# Patient Record
Sex: Female | Born: 1975 | Race: White | Hispanic: No | Marital: Married | Smoking: Former smoker
Health system: Southern US, Community
[De-identification: ages and names within clinical notes are randomized; demographics above are authoritative.]

## PROBLEM LIST (undated history)

## (undated) DIAGNOSIS — G43909 Migraine, unspecified, not intractable, without status migrainosus: Secondary | ICD-10-CM

## (undated) DIAGNOSIS — F419 Anxiety disorder, unspecified: Secondary | ICD-10-CM

## (undated) DIAGNOSIS — R51 Headache: Secondary | ICD-10-CM

## (undated) DIAGNOSIS — Z803 Family history of malignant neoplasm of breast: Secondary | ICD-10-CM

## (undated) HISTORY — PX: WISDOM TOOTH EXTRACTION: SHX21

## (undated) HISTORY — DX: Family history of malignant neoplasm of breast: Z80.3

## (undated) HISTORY — DX: Anxiety disorder, unspecified: F41.9

---
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
## 2010-09-05 ENCOUNTER — Encounter: Payer: Self-pay | Admitting: Family Medicine

## 2010-09-05 ENCOUNTER — Ambulatory Visit (INDEPENDENT_AMBULATORY_CARE_PROVIDER_SITE_OTHER): Payer: 59 | Admitting: Family Medicine

## 2010-09-05 VITALS — BP 113/81 | HR 97 | Ht 63.5 in | Wt 163.0 lb

## 2010-09-05 DIAGNOSIS — Z23 Encounter for immunization: Secondary | ICD-10-CM

## 2010-09-05 DIAGNOSIS — J309 Allergic rhinitis, unspecified: Secondary | ICD-10-CM

## 2010-09-05 MED ORDER — TETANUS-DIPHTH-ACELL PERTUSSIS 5-2-15.5 LF-MCG/0.5 IM SUSP: 0.5000 mL | Freq: Once | INTRAMUSCULAR | Status: DC

## 2010-09-05 NOTE — Assessment & Plan Note (Signed)
Will start Qnasl sample daily for nasal congestion/ postnasal drip, long hx of allergies.  Call if working well for RX.  RTC for physical/ fasting labs in 3 mos with flu shot. PPD and Tdap today.

## 2010-09-05 NOTE — Progress Notes (Signed)
  Subjective:    Patient ID: Jasmine Gomez, female    DOB: 03/11/1975, 35 y.o.   MRN: [id number]  HPI  35 yo Wf presents for NOV.  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  She just moved here from [place] with her family.  Her late father in law was Elmer Boutelle.  She has hx of allergies and sinus surgery and quit smoking a year ago but is o/w healthy.  She needs  A PPD today and her Tdap.  BP 113/81  Pulse 97  Ht 5' 3.5" (1.613 m)  Wt 163 lb (73.936 kg)  BMI 28.42 kg/m2  SpO2 98%  LMP 08/27/2010  BP 113/81  Pulse 97  Ht 5' 3.5" (1.613 m)  Wt 163 lb (73.936 kg)  BMI 28.42 kg/m2  SpO2 98%  LMP 08/27/2010  No past medical history on file.  Past Surgical History  Procedure Date  . Nasal sinus surgery 1989  . Tonsillectomy 1988    Family History  Problem Relation Age of Onset  . Cancer Mother   . Hyperlipidemia Father   . Cancer Sister     History   Social History  . Marital Status: Married    Spouse Name: Casimiro Needle    Number of Children: 2  . Years of Education: 17   Occupational History  . teacher     [employer]   Social History Main Topics  . Smoking status: Former Smoker -- 10 years  . Smokeless tobacco: Not on file  . Alcohol Use: 1.2 oz/week    2 Glasses of wine per week  . Drug Use: No  . Sexually Active: Yes    Birth Control/ Protection: Condom   Other Topics Concern  . Not on file   Social History Narrative  . No narrative on file    Not on File  No current outpatient prescriptions on file. Current facility-administered medications:TDaP (ADACEL) injection 0.5 mL, 0.5 mL, Intramuscular, Once, Seymour Bars, DO  Review of Systems  HENT: Positive for congestion and postnasal drip. Negative for rhinorrhea.   Respiratory: Negative for shortness of breath.   Cardiovascular: Negative for chest pain and palpitations.  Neurological: Negative for headaches.       Objective:   Physical Exam  Constitutional: She  appears well-developed and well-nourished. No distress.  HENT:  Right Ear: External ear normal.  Left Ear: External ear normal.  Mouth/Throat: Oropharynx is clear and moist.       Cobblestoning in the posterior oropharynx; boggy L>R nasal turbinates  Eyes: Conjunctivae are normal.  Cardiovascular: Normal rate, regular rhythm and normal heart sounds.   No murmur heard. Pulmonary/Chest: Effort normal and breath sounds normal.  Musculoskeletal: She exhibits no edema.  Lymphadenopathy:    She has no cervical adenopathy.  Skin: Skin is warm and dry.  Psychiatric: She has a normal mood and affect.          Assessment & Plan:

## 2010-09-05 NOTE — Patient Instructions (Signed)
Start Qnasl 2 sprays per nostril once daily. Add OTC anti histamine if needed for runny nose, sneezing, etc.  Return to have PPD read on Monday and will give you yellow form then.  Tdap updated today.

## 2010-09-08 LAB — TB SKIN TEST: TB Skin Test: NEGATIVE mm

## 2010-10-02 ENCOUNTER — Encounter: Payer: 59 | Admitting: Family Medicine

## 2010-10-02 DIAGNOSIS — Z0289 Encounter for other administrative examinations: Secondary | ICD-10-CM

## 2011-01-01 ENCOUNTER — Encounter: Payer: Self-pay | Admitting: *Deleted

## 2011-01-01 ENCOUNTER — Emergency Department
Admission: EM | Admit: 2011-01-01 | Discharge: 2011-01-01 | Disposition: A | Discharge status: Home / Self Care | Payer: BC Managed Care – PPO | Source: Home / Self Care | Attending: Emergency Medicine | Admitting: Emergency Medicine

## 2011-01-01 DIAGNOSIS — J101 Influenza due to other identified influenza virus with other respiratory manifestations: Secondary | ICD-10-CM

## 2011-01-01 DIAGNOSIS — J111 Influenza due to unidentified influenza virus with other respiratory manifestations: Secondary | ICD-10-CM

## 2011-01-01 LAB — POCT INFLUENZA A/B
Influenza A, POC: POSITIVE
Influenza B, POC: NEGATIVE

## 2011-01-01 MED ORDER — OSELTAMIVIR PHOSPHATE 75 MG PO CAPS: ORAL_CAPSULE | ORAL | Status: AC

## 2011-01-01 NOTE — ED Notes (Signed)
Patient c/o fever, body aches, HA x yesterday. She had a flu shot on Monday. 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 She also c/o sharp pains in her left abdomen intermittent x today.

## 2011-01-01 NOTE — ED Provider Notes (Signed)
History     CSN: [id number] Arrival date & time: 01/01/2011  4:16 PM   First MD Initiated Contact with Patient 01/01/11 1608      Chief Complaint  Patient presents with  . Fever  . Generalized Body Aches    HPI Comments: HPI : Flu symptoms for about 1 day. Fever to 102 with chills, sweats, myalgias, fatigue, headache. Symptoms are progressively worsening, despite trying OTC fever reducing medicine and rest and fluids. Has decreased appetite, but tolerating some liquids by mouth. No history of recent tick bite.  Review of Systems: Positive for fatigue, mild nasal congestion, mild sore throat, mild swollen anterior neck glands, mild cough. Negative for acute vision changes, stiff neck, focal weakness, syncope, seizures, respiratory distress, vomiting, diarrhea, GU symptoms.  Patient is a 35 y.o. female presenting with fever. The history is provided by the patient.  Fever Primary symptoms of the febrile illness include fever, fatigue, headaches, cough (Nonproductive) and myalgias (Severe). Primary symptoms do not include wheezing, shortness of breath, abdominal pain, nausea, vomiting, diarrhea, dysuria, altered mental status or rash. The current episode started yesterday. The problem has been rapidly worsening.    History reviewed. No pertinent past medical history.  Past Surgical History  Procedure Date  . Nasal sinus surgery 1989  . Tonsillectomy 1988    Family History  Problem Relation Age of Onset  . Cancer Mother   . Hyperlipidemia Father   . Cancer Sister     History  Substance Use Topics  . Smoking status: Former Smoker -- 10 years  . Smokeless tobacco: Not on file  . Alcohol Use: 1.2 oz/week    2 Glasses of wine per week    OB History    Grav Para Term Preterm Abortions TAB SAB Ect Mult Living                  Review of Systems  Constitutional: Positive for fever, chills, appetite change and fatigue.  HENT: Positive for rhinorrhea (Serous).   Respiratory:  Positive for cough (Nonproductive). Negative for shortness of breath and wheezing.   Cardiovascular: Negative.   Gastrointestinal: Negative.  Negative for nausea, vomiting, abdominal pain and diarrhea.  Genitourinary: Negative.  Negative for dysuria.  Musculoskeletal: Positive for myalgias (Severe).  Skin: Negative for rash.  Neurological: Positive for headaches.  Hematological: Negative.   Psychiatric/Behavioral: Negative.  Negative for altered mental status.  All other systems reviewed and are negative.    Allergies  Review of patient's allergies indicates no known allergies.  Home Medications   Current Outpatient Rx  Name Route Sig Dispense Refill  . OSELTAMIVIR PHOSPHATE 75 MG PO CAPS  Starting today, take 1 capsule by mouth twice a day for 5 days. 10 capsule 0    BP 113/76  Pulse 102  Temp(Src) 101.1 F (38.4 C) (Oral)  Resp 14  Ht 5\' 4"  (1.626 m)  Wt 161 lb (73.029 kg)  BMI 27.64 kg/m2  SpO2 99%  Physical Exam  Nursing note and vitals reviewed. Constitutional: She appears well-developed and well-nourished.  Non-toxic appearance. She appears ill (very fatigued, but no cardiorespiratory distress). No distress.  HENT:  Head: Normocephalic and atraumatic.  Right Ear: Tympanic membrane and external ear normal.  Left Ear: Tympanic membrane and external ear normal.  Nose: Rhinorrhea present.  Mouth/Throat: Mucous membranes are normal. Posterior oropharyngeal erythema (mild redness ) present. No oropharyngeal exudate.  Eyes: Conjunctivae are normal. Right eye exhibits no discharge. Left eye exhibits no discharge. No scleral icterus.  Neck: Neck supple.  Cardiovascular: Normal rate, regular rhythm and normal heart sounds.   Pulmonary/Chest: Breath sounds normal. No stridor. No respiratory distress. She has no wheezes. She has no rales.  Abdominal: Soft. There is no tenderness.  Musculoskeletal: She exhibits no edema.  Lymphadenopathy:    She has cervical adenopathy (mild  shoddy anterior cervical nodes).  Neurological: She is alert.  Skin: Skin is warm and intact. No rash noted. She is diaphoretic.  Psychiatric: She has a normal mood and affect.   neurologic exam within normal limits, nonfocal  ED Course  Procedures (including critical care time)   Labs Reviewed  POCT INFLUENZA A/B     1. Influenza A       MDM  Her rapid flu test is positive for influenza A., negative for influenza B. After risks benefits alternatives discussed, Tamiflu prescribed. Other symptomatic care discussed. See AVS for other information and advice. Contagiousness discussed.        Lonell Face, MD 01/01/11 [phone]

## 2011-01-16 ENCOUNTER — Telehealth: Payer: Self-pay | Admitting: Family Medicine

## 2011-01-16 DIAGNOSIS — Z1231 Encounter for screening mammogram for malignant neoplasm of breast: Secondary | ICD-10-CM

## 2011-01-16 NOTE — Telephone Encounter (Signed)
Has She double checked with her insurance to make sure that they will cover it at 35? Or does she have a family history of breast cancer. I will place the order.

## 2011-01-16 NOTE — Telephone Encounter (Signed)
Patient called request to get an order to have a mammogram

## 2011-01-16 NOTE — Telephone Encounter (Signed)
She has family history of breast cancer....mat GM, mother and sister.

## 2011-02-03 ENCOUNTER — Ambulatory Visit
Admission: RE | Admit: 2011-02-03 | Discharge: 2011-02-03 | Disposition: A | Discharge status: Home / Self Care | Payer: BC Managed Care – PPO | Source: Ambulatory Visit | Attending: Family Medicine | Admitting: Family Medicine

## 2011-02-03 DIAGNOSIS — Z1231 Encounter for screening mammogram for malignant neoplasm of breast: Secondary | ICD-10-CM

## 2011-03-25 ENCOUNTER — Telehealth: Payer: Self-pay | Admitting: *Deleted

## 2011-03-25 NOTE — Telephone Encounter (Signed)
Pt calls and states last night started running a temp of 101.5, H/A, bodyaches, congestion, chills, feels horrible and ? If its the flu. Do you want to see her or just send a med to pharmacy. Sx's started last night

## 2011-03-25 NOTE — Telephone Encounter (Signed)
Dallie Dad, LPN 1/61/0960 45:40 AM Signed  Pt calls and states last night started running a temp of 101.5, H/A, bodyaches, congestion, chills, feels horrible and ? If its the flu. Do you want to see her or just send a med to pharmacy. Sx's started last night

## 2011-03-25 NOTE — Telephone Encounter (Signed)
Because of the shortage of Tamiflu they are only recommending for pediatric and elderly patient. THus cold meds will be her friend, hydration, humidifier, vicks vapor rub.

## 2011-03-25 NOTE — Telephone Encounter (Signed)
Pt notified of MD instructions. KJ LPN 

## 2011-11-16 ENCOUNTER — Ambulatory Visit (INDEPENDENT_AMBULATORY_CARE_PROVIDER_SITE_OTHER): Payer: BC Managed Care – PPO | Admitting: Family Medicine

## 2011-11-16 ENCOUNTER — Encounter: Payer: BC Managed Care – PPO | Admitting: Family Medicine

## 2011-11-16 ENCOUNTER — Other Ambulatory Visit (HOSPITAL_COMMUNITY)
Admission: RE | Admit: 2011-11-16 | Discharge: 2011-11-16 | Disposition: A | Discharge status: Home / Self Care | Payer: BC Managed Care – PPO | Source: Ambulatory Visit | Attending: Family Medicine | Admitting: Family Medicine

## 2011-11-16 ENCOUNTER — Encounter: Payer: Self-pay | Admitting: Family Medicine

## 2011-11-16 VITALS — BP 118/82 | HR 80 | Temp 98.0°F | Ht 64.0 in | Wt 157.0 lb

## 2011-11-16 DIAGNOSIS — J029 Acute pharyngitis, unspecified: Secondary | ICD-10-CM

## 2011-11-16 DIAGNOSIS — Z01419 Encounter for gynecological examination (general) (routine) without abnormal findings: Secondary | ICD-10-CM

## 2011-11-16 DIAGNOSIS — Z803 Family history of malignant neoplasm of breast: Secondary | ICD-10-CM

## 2011-11-16 DIAGNOSIS — R030 Elevated blood-pressure reading, without diagnosis of hypertension: Secondary | ICD-10-CM

## 2011-11-16 DIAGNOSIS — Z1151 Encounter for screening for human papillomavirus (HPV): Secondary | ICD-10-CM | POA: Insufficient documentation

## 2011-11-16 DIAGNOSIS — G43109 Migraine with aura, not intractable, without status migrainosus: Secondary | ICD-10-CM | POA: Insufficient documentation

## 2011-11-16 DIAGNOSIS — Z23 Encounter for immunization: Secondary | ICD-10-CM

## 2011-11-16 HISTORY — DX: Family history of malignant neoplasm of breast: Z80.3

## 2011-11-16 LAB — POCT RAPID STREP A (OFFICE): Rapid Strep A Screen: NEGATIVE

## 2011-11-16 MED ORDER — FROVATRIPTAN SUCCINATE 2.5 MG PO TABS: 2.5000 mg | ORAL_TABLET | ORAL | Status: DC | PRN

## 2011-11-16 NOTE — Progress Notes (Signed)
Subjective:     Jasmine Gomez is a 36 y.o. female and is here for a comprehensive physical exam. The patient reports problems - migraines. She says her migraines have been well controlled since the birth of her kids but had a bad one last week. lasted 3 days. She does get aura. vomited.  Tried execedrin migraine with no relief.  Marland Kitchen She's also had a sore throat for couple days. No fever. She has no other upper respiratory symptoms except for the sore throat. A little bit of postnasal drip. She's not taking any medications for. 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  History   Social History  . Marital Status: Married    Spouse Name: Casimiro Needle    Number of Children: 2  . Years of Education: 17   Occupational History  . teacher     [employer]   Social History Main Topics  . Smoking status: Former Smoker -- 10 years  . Smokeless tobacco: Not on file  . Alcohol Use: 1.2 oz/week    2 Glasses of wine per week  . Drug Use: No  . Sexually Active: Yes    Birth Control/ Protection: Condom   Other Topics Concern  . Not on file   Social History Narrative  . No narrative on file   Health Maintenance  Topic Date Due  . Influenza Vaccine  09/26/2012  . Pap Smear  11/16/2014  . Tetanus/tdap  09/04/2020    The following portions of the patient's history were reviewed and updated as appropriate: allergies, current medications, past family history, past medical history, past social history, past surgical history and problem list.  Review of Systems A comprehensive review of systems was negative.   Objective:    BP 147/79  Pulse 80  Temp 98 F (36.7 C)  Ht 5\' 4"  (1.626 m)  Wt 157 lb (71.215 kg)  BMI 26.95 kg/m2 General appearance: alert, cooperative and appears stated age Head: Normocephalic, without obvious abnormality, atraumatic Eyes: conj clear, EOMi, PEERLA Ears: normal TM's and external ear canals both ears Nose: Nares normal.  Septum midline. Mucosa normal. No drainage or sinus tenderness. Throat: lips, mucosa, and tongue normal; teeth and gums normal Neck: no adenopathy, no carotid bruit, no JVD, supple, symmetrical, trachea midline and thyroid not enlarged, symmetric, no tenderness/mass/nodules Back: symmetric, no curvature. ROM normal. No CVA tenderness. Lungs: clear to auscultation bilaterally Breasts: normal appearance, no masses or tenderness Heart: regular rate and rhythm, S1, S2 normal, no murmur, click, rub or gallop Abdomen: soft, non-tender; bowel sounds normal; no masses,  no organomegaly Pelvic: cervix normal in appearance, external genitalia normal, no adnexal masses or tenderness, no cervical motion tenderness, rectovaginal septum normal, uterus normal size, shape, and consistency and vagina normal without discharge Extremities: extremities normal, atraumatic, no cyanosis or edema Pulses: 2+ and symmetric Skin: Skin color, texture, turgor normal. No rashes or lesions Lymph nodes: Cervical, supraclavicular, and axillary nodes normal. Neurologic: Alert and oriented X 3, normal strength and tone. Normal symmetric reflexes. Normal coordination and gait    Assessment:    Healthy female exam.      Plan:    Start a regular exercise program and make sure you are eating a healthy diet Try to eat 4 servings of dairy a day or take a calcium supplement (500mg  twice a day). Your vaccines are up to date.    Migarines w/ aura - Discussed inc risk of stroke.  Make sure  BP  well controlled and cholesterol also well controlled. Due for fasting lab work. We'll check CT and lipids. I don't think she needs anything prophylactic at this point we do find her rescue medication that works consistently for her. Gave Frova samples.    Pharyngitis-will check for strep since she has had some exposure. They likely viral. Rapid strep is negative.  Continue symptomatic care.   Elevated BP - Recheck was normal.  Will keep an  eye on it.   See After Visit Summary for Counseling Recommendations   Flu vaccine given today.

## 2011-11-18 LAB — LIPID PANEL
Cholesterol: 220 mg/dL — ABNORMAL HIGH (ref 0–200)
Total CHOL/HDL Ratio: 5.1 Ratio

## 2011-11-18 LAB — COMPLETE METABOLIC PANEL WITH GFR
ALT: 12 U/L (ref 0–35)
CO2: 30 mEq/L (ref 19–32)
Calcium: 9.4 mg/dL (ref 8.4–10.5)
Chloride: 103 mEq/L (ref 96–112)
Creat: 0.74 mg/dL (ref 0.50–1.10)
GFR, Est African American: >89 mL/min
GFR, Est Non African American: >89 mL/min
Glucose, Bld: 86 mg/dL (ref 70–99)
Total Bilirubin: 0.6 mg/dL (ref 0.3–1.2)

## 2011-11-30 ENCOUNTER — Telehealth: Payer: Self-pay | Admitting: *Deleted

## 2011-12-10 NOTE — Telephone Encounter (Signed)
See other message

## 2012-01-15 ENCOUNTER — Ambulatory Visit (INDEPENDENT_AMBULATORY_CARE_PROVIDER_SITE_OTHER): Payer: BC Managed Care – PPO | Admitting: Physician Assistant

## 2012-01-15 ENCOUNTER — Encounter: Payer: Self-pay | Admitting: Physician Assistant

## 2012-01-15 VITALS — BP 127/79 | HR 98 | Temp 98.9°F | Resp 16 | Wt 155.0 lb

## 2012-01-15 DIAGNOSIS — J069 Acute upper respiratory infection, unspecified: Secondary | ICD-10-CM

## 2012-01-15 DIAGNOSIS — Z2089 Contact with and (suspected) exposure to other communicable diseases: Secondary | ICD-10-CM

## 2012-01-15 DIAGNOSIS — R0981 Nasal congestion: Secondary | ICD-10-CM

## 2012-01-15 DIAGNOSIS — Z20818 Contact with and (suspected) exposure to other bacterial communicable diseases: Secondary | ICD-10-CM

## 2012-01-15 DIAGNOSIS — J3489 Other specified disorders of nose and nasal sinuses: Secondary | ICD-10-CM

## 2012-01-15 LAB — POCT RAPID STREP A (OFFICE): Rapid Strep A Screen: NEGATIVE

## 2012-01-15 MED ORDER — FLUTICASONE PROPIONATE 50 MCG/ACT NA SUSP: 2.0000 | Freq: Every day | NASAL | Status: DC

## 2012-01-15 NOTE — Progress Notes (Signed)
  Subjective:    Patient ID: Jasmine Gomez, female    DOB: March 25, 1975, 36 y.o.   MRN: [id number]  HPI Patient presents to the clinic with nasal congestion, sinus pressure, and sinus congestion. She has a mild sore throat. She wants to be tested for strep because both of her sons have strep. Her symptoms started 2 days ago. She has tried Dayquil and not really helped. She denies fever, chills, muscle aches. She has been having HA's and runny to congested nose. Denies any N/V/D.    Review of Systems     Objective:   Physical Exam  Constitutional: She is oriented to person, place, and time. She appears well-developed and well-nourished.  HENT:  Head: Normocephalic and atraumatic.  Right Ear: External ear normal.  Left Ear: External ear normal.       TM's clear bilaterally.   Oropharynx red without exudate.   Bilateral turbinates red and swollen with rhinorrhea.   No sinus pressure.  Eyes: Conjunctivae normal are normal.  Neck: Normal range of motion. Neck supple.  Cardiovascular: Normal rate, regular rhythm and normal heart sounds.   Pulmonary/Chest: Effort normal and breath sounds normal. She has no wheezes.  Lymphadenopathy:    She has no cervical adenopathy.  Neurological: She is alert and oriented to person, place, and time.  Skin: Skin is warm and dry.  Psychiatric: She has a normal mood and affect. Her behavior is normal.          Assessment & Plan:  URI- Rapid strep was negative. Discussed Viral etiology. Symptomatic treatment with Mucinex D BID drinking lots of water. Load up on Vitamin C and Zinc. Discussed could turn into infection if mucus sits around long enough work hard to get mucus out consider sinus rinse. If not improving in 6-8 days or worsening give [place] a call and will consider calling in abx. Did give nasal spray to try 2 sprays once a day.

## 2012-01-15 NOTE — Patient Instructions (Addendum)

## 2012-04-04 ENCOUNTER — Encounter: Payer: Self-pay | Admitting: Family Medicine

## 2012-04-04 ENCOUNTER — Ambulatory Visit (INDEPENDENT_AMBULATORY_CARE_PROVIDER_SITE_OTHER): Payer: BC Managed Care – PPO | Admitting: Family Medicine

## 2012-04-04 VITALS — BP 108/71 | HR 78 | Ht 64.0 in | Wt 161.0 lb

## 2012-04-04 DIAGNOSIS — R41840 Attention and concentration deficit: Secondary | ICD-10-CM

## 2012-04-04 DIAGNOSIS — F430 Acute stress reaction: Secondary | ICD-10-CM

## 2012-04-04 NOTE — Progress Notes (Signed)
  Subjective:    Patient ID: Jasmine Gomez, female    DOB: 1975/03/20, 37 y.o.   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 Will constantly get distracted and get up form what she is doing. Feels like it is affecting her work. Says can't even watch a movie. Will be on the ipad adn doing facebook.  Hard to concentrate on reading material and then has to re-read it. Says always fidgeting and picking.  Says has had this for years.  Did well in school. Lately strugging with appointments.  Feels she does worry a lot and feels anxious.  Denies feeling depressed. Interups people frequently. She has also had a rough year. She and her husband moved her about 2 years ago. He had difficulty finding a job and they first moved here, for about a year and half. Thus they went through the money that they had made off the sell of the house. He finally found a job. 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 This is also been very stressful for her. She says at times she feels down but does not feel depressed per se.   Review of Systems     Objective:   Physical Exam  Constitutional: She is oriented to person, place, and time. She appears well-developed and well-nourished.  HENT:  Head: Normocephalic and atraumatic.  Eyes: Conjunctivae and EOM are normal.  Cardiovascular: Normal rate.   Pulmonary/Chest: Effort normal.  Neurological: She is alert and oriented to person, place, and time.  Skin: Skin is dry. No pallor.  Psychiatric: She has a normal mood and affect. Her behavior is normal.          Assessment & Plan:  Suspect ADHD - based on her discussion of her symptoms I do think she is most likely ADHD. I gave her an adult ADHD/RS/4 with adults prompts questionnaire. She scored 38 (positive for ADHD) .  Will refer to Dr. Dellia Cloud for futher evaluation for ADHD. I will see her back after I get her  formal evaluation we can discuss possible treatment options. I did encourage her to get a book about ADD and ADHD. I think would be very helpful for her to read this if she is formally diagnosed.  Mild depression- pHQ-9 score  17f 8. Though she scored trouble concentrating as of 3. I think she does have some mild depressive symptoms but I feel this is unrelated to her ADD sxs.  We will keep an eye on this. Please call she feels that her moods getting worse, , or if she starts to feel hopeless.  Time spent 30 min,>50% spent in evaluation and counseling about possible ADHD.

## 2012-07-25 ENCOUNTER — Ambulatory Visit (INDEPENDENT_AMBULATORY_CARE_PROVIDER_SITE_OTHER): Payer: BC Managed Care – PPO | Admitting: Family Medicine

## 2012-07-25 ENCOUNTER — Encounter: Payer: Self-pay | Admitting: Family Medicine

## 2012-07-25 VITALS — BP 111/74 | HR 63 | Wt 163.0 lb

## 2012-07-25 DIAGNOSIS — N760 Acute vaginitis: Secondary | ICD-10-CM

## 2012-07-25 DIAGNOSIS — N926 Irregular menstruation, unspecified: Secondary | ICD-10-CM

## 2012-07-25 LAB — WET PREP FOR TRICH, YEAST, CLUE
Clue Cells Wet Prep HPF POC: NONE SEEN
Yeast Wet Prep HPF POC: NONE SEEN

## 2012-07-25 LAB — TSH: TSH: 1.834 u[IU]/mL (ref 0.350–4.500)

## 2012-07-25 MED ORDER — FLUCONAZOLE 150 MG PO TABS: ORAL_TABLET | ORAL | Status: AC

## 2012-07-25 NOTE — Progress Notes (Signed)
CC: Jasmine Gomez is a 37 y.o. female is here for Amenorrhea and Vaginitis   Subjective: HPI:  Patient complaint of a missed period that was expected prior to the 15th of this month.  Prior to this her menses cycle was extremely predictable occurring every 28 days and lasting approximately 5 days. Her most recent period did linger about 3 days longer than she expected with spotting. She has taken 3 Pregnancy tests most recently one on Saturday all of which have been negative, she reports in her prior pregnancies she was quite symptomatic at this time following a missed period.  She denies dietary changes but does admit to starting 30-60 minutes of exercise everyday the week since she had her last period prior to this she describes herself as quite sedentary. She denies any abdominal pain, pelvic pain, fatigue, heat or cold intolerance, unintentional weight gain or loss, peripheral vision loss nor headaches.  She complains of an irritation and redness between her legs in the genital region without actual discharge nor dysuria. It is slightly improved with Monistat but is still bothering her with moderate burning that is nonradiating. Nothing else has made it better or worse it is been present for about a week.   Review Of Systems Outlined In HPI  Past Medical History  Diagnosis Date  . Family history of breast cancer 11/16/2011    Mother and sister with  BrCa.  Patient is neg for BrCa gene.        Family History  Problem Relation Age of Onset  . Breast cancer Mother   . Hyperlipidemia Father   . Breast cancer Sister      History  Substance Use Topics  . Smoking status: Former Smoker -- 10 years  . Smokeless tobacco: Not on file  . Alcohol Use: 1.2 oz/week    2 Glasses of wine per week     Objective: Filed Vitals:   07/25/12 1111  BP: 111/74  Pulse: 63    Vital signs reviewed. General: Alert and Oriented, No Acute Distress HEENT: Pupils equal, round, reactive to light.  Conjunctivae clear.  External ears unremarkable.  Moist mucous membranes. Lungs: Clear and comfortable work of breathing, speaking in full sentences without accessory muscle use. Cardiac: Regular rate and rhythm.  Neuro: CN II-XII grossly intact, gait normal. Extremities: No peripheral edema.  Strong peripheral pulses.  Mental Status: No depression, anxiety, nor agitation. Logical though process. Skin: Warm and dry.  Assessment & Plan: Jasmine Gomez was seen today for amenorrhea and vaginitis.  Diagnoses and associated orders for this visit:  Vaginitis and vulvovaginitis - Cervicovaginal ancillary only - fluconazole (DIFLUCAN) 150 MG tablet; Take one tab, may take second tab if no improvement after 72 hours.  Irregular menstrual cycle - Prolactin - TSH    Irregular menstrual cycle: Patient declines urinary pregnancy test is open to the idea of ruling out prolactin or thyroid abnormalities discussed possibility that her significant increase in daily exercise could be contributing to her irregular cycle. Vaginitis: Suspicion for vaginal candidiasis start Diflucan and a wet prep has been obtained  Return if symptoms worsen or fail to improve.

## 2012-07-25 NOTE — Addendum Note (Signed)
Addended by: Wyline Beady on: 07/25/2012 02:25 PM   Modules accepted: Orders

## 2012-12-02 ENCOUNTER — Encounter: Payer: Self-pay | Admitting: Family Medicine

## 2012-12-02 ENCOUNTER — Ambulatory Visit (INDEPENDENT_AMBULATORY_CARE_PROVIDER_SITE_OTHER): Payer: BC Managed Care – PPO | Admitting: Family Medicine

## 2012-12-02 VITALS — BP 117/76 | HR 73 | Temp 99.0°F | Wt 156.0 lb

## 2012-12-02 DIAGNOSIS — B9689 Other specified bacterial agents as the cause of diseases classified elsewhere: Secondary | ICD-10-CM

## 2012-12-02 DIAGNOSIS — J329 Chronic sinusitis, unspecified: Secondary | ICD-10-CM

## 2012-12-02 DIAGNOSIS — A499 Bacterial infection, unspecified: Secondary | ICD-10-CM

## 2012-12-02 DIAGNOSIS — R05 Cough: Secondary | ICD-10-CM

## 2012-12-02 MED ORDER — AMOXICILLIN-POT CLAVULANATE 500-125 MG PO TABS: ORAL_TABLET | ORAL | Status: AC

## 2012-12-02 MED ORDER — HYDROCODONE-HOMATROPINE 5-1.5 MG/5ML PO SYRP: 5.0000 mL | ORAL_SOLUTION | Freq: Every evening | ORAL | Status: DC | PRN

## 2012-12-02 NOTE — Progress Notes (Signed)
CC: Jasmine Gomez is a 37 y.o. female is here for Nasal Congestion and Cough   Subjective: HPI:  Complains of cough dry nonproductive along with facial pressure below both eyes that has been present for one week worsening on a daily basis present all hours of the day at least mild severity however moderate in the evening when lying down for sleep. Asked him that she's had difficulty sleeping due to the cough. She describes subjective postnasal drip. Has tried over-the-counter cough syrup without much improvement. She reports fatigue but denies fevers, chills, motor sensory disturbances, shortness of breath, wheezing, chest pain nor myalgias.  Review Of Systems Outlined In HPI  Past Medical History  Diagnosis Date  . Family history of breast cancer 11/16/2011    Mother and sister with  BrCa.  Patient is neg for BrCa gene.        Family History  Problem Relation Age of Onset  . Breast cancer Mother   . Hyperlipidemia Father   . Breast cancer Sister      History  Substance Use Topics  . Smoking status: Former Smoker -- 10 years  . Smokeless tobacco: Not on file  . Alcohol Use: 1.2 oz/week    2 Glasses of wine per week     Objective: Filed Vitals:   12/02/12 1428  BP: 117/76  Pulse: 73  Temp: 99 F (37.2 C)    General: Alert and Oriented, No Acute Distress HEENT: Pupils equal, round, reactive to light. Conjunctivae clear.  External ears unremarkable, canals clear with intact TMs with appropriate landmarks.  Middle ear appears open without effusion.  mildly swollen and erythematous inferior turbinates with moderate mucoid discharge.  Moist mucous membranes, pharynx without inflammation nor lesions.  Neck supple without palpable lymphadenopathy nor abnormal masses.Maxillary sinus tenderness to percussion bilaterally  Lungs: Clear to auscultation bilaterally, no wheezing/ronchi/rales.  Comfortable work of breathing. Good air movement. Mental Status: No depression, anxiety, nor  agitation. Skin: Warm and dry.  Assessment & Plan: Freya was seen today for nasal congestion and cough.  Diagnoses and associated orders for this visit:  Bacterial sinusitis - HYDROcodone-homatropine (HYCODAN) 5-1.5 MG/5ML syrup; Take 5 mLs by mouth at bedtime as needed for cough. - amoxicillin-clavulanate (AUGMENTIN) 500-125 MG per tablet; Take one by mouth every 8 hours for ten total days.  Cough - HYDROcodone-homatropine (HYCODAN) 5-1.5 MG/5ML syrup; Take 5 mLs by mouth at bedtime as needed for cough. - amoxicillin-clavulanate (AUGMENTIN) 500-125 MG per tablet; Take one by mouth every 8 hours for ten total days.    Bacterial sinusitis resulting postnasal drip causing cough start Augmentin consider Hycodan to help with sleep, consider Alka-Seltzer cold and sinus over-the-counter to help with symptoms as needed  Return if symptoms worsen or fail to improve.

## 2012-12-26 ENCOUNTER — Encounter: Payer: Self-pay | Admitting: Family Medicine

## 2012-12-26 ENCOUNTER — Ambulatory Visit (INDEPENDENT_AMBULATORY_CARE_PROVIDER_SITE_OTHER): Payer: BC Managed Care – PPO | Admitting: Family Medicine

## 2012-12-26 VITALS — BP 120/76 | HR 82 | Temp 99.7°F | Ht 64.0 in | Wt 155.0 lb

## 2012-12-26 DIAGNOSIS — Z331 Pregnant state, incidental: Secondary | ICD-10-CM

## 2012-12-26 DIAGNOSIS — Z3490 Encounter for supervision of normal pregnancy, unspecified, unspecified trimester: Secondary | ICD-10-CM

## 2012-12-26 DIAGNOSIS — N912 Amenorrhea, unspecified: Secondary | ICD-10-CM

## 2012-12-26 DIAGNOSIS — Z309 Encounter for contraceptive management, unspecified: Secondary | ICD-10-CM

## 2012-12-26 DIAGNOSIS — Z3201 Encounter for pregnancy test, result positive: Secondary | ICD-10-CM

## 2012-12-26 LAB — POCT URINE PREGNANCY: Preg Test, Ur: POSITIVE

## 2012-12-26 MED ORDER — COMPLETENATE 29-1 MG PO CHEW: 1.0000 | CHEWABLE_TABLET | Freq: Every day | ORAL | Status: DC

## 2012-12-26 NOTE — Patient Instructions (Addendum)
Estimated due date is July 25th, 2015 You are 6 weeks and 2 days today.

## 2012-12-26 NOTE — Progress Notes (Signed)
   Subjective:    Patient ID: Jasmine Gomez, female    DOB: 28-Mar-1975, 37 y.o.   MRN: [id number]  HPI LMP was 11/12/12 and has missed her period. . She has been tired and hungry all the time. Has had increased since of smell.  No pelvic pain or spotting.  Some cramping. Was on augmentin for sinus infection about 3 weeks ago.    Review of Systems     Objective:   Physical Exam  Constitutional: She is oriented to person, place, and time. She appears well-developed and well-nourished.  HENT:  Head: Normocephalic and atraumatic.  Neurological: She is alert and oriented to person, place, and time.  Skin: Skin is warm and dry.  Psychiatric: She has a normal mood and affect. Her behavior is normal.          Assessment & Plan:  Pregnancy-confirmed with urine pregnancy test. We'll get a serum quantitative. Estimated due date would be 08/19/2013. Her husband is here with her and is very supportive. She's excited about the pregnancy. We'll start her on prenatal vitamin. Samples given. We'll go ahead and send prescription to the pharmacy as well.Given samples of Concept DHA and Provida OB.  She prefers a chewable but these are hard to find.  Will refer

## 2013-01-06 ENCOUNTER — Telehealth: Payer: Self-pay | Admitting: *Deleted

## 2013-01-06 MED ORDER — PROVIDA OB 20-20-1.25 MG PO CAPS: 1.0000 | ORAL_CAPSULE | Freq: Every day | ORAL | Status: DC

## 2013-01-06 NOTE — Telephone Encounter (Signed)
The problem, new prescription sent.

## 2013-01-06 NOTE — Telephone Encounter (Signed)
Pt was prescribed the chewable prenatal vitamin but her ins doesn't cover it & she would like the provida OB sent in to her pharm instead.

## 2013-01-06 NOTE — Telephone Encounter (Signed)
Left rx information on vm.

## 2013-01-10 ENCOUNTER — Ambulatory Visit (INDEPENDENT_AMBULATORY_CARE_PROVIDER_SITE_OTHER): Payer: BC Managed Care – PPO | Admitting: Obstetrics & Gynecology

## 2013-01-10 ENCOUNTER — Encounter: Payer: Self-pay | Admitting: Obstetrics & Gynecology

## 2013-01-10 VITALS — BP 111/72 | Wt 159.0 lb

## 2013-01-10 DIAGNOSIS — O09529 Supervision of elderly multigravida, unspecified trimester: Secondary | ICD-10-CM | POA: Insufficient documentation

## 2013-01-10 DIAGNOSIS — O09521 Supervision of elderly multigravida, first trimester: Secondary | ICD-10-CM

## 2013-01-10 NOTE — Progress Notes (Signed)
p-89  Bedside U/S showed viable IUP with FHT 182 BPM  GA [redacted]w[redacted]d and CRL 22.76mm.  Last pap 2013

## 2013-01-10 NOTE — Addendum Note (Signed)
Addended by: Granville Lewis on: 01/10/2013 04:57 PM   Modules accepted: Orders

## 2013-01-10 NOTE — Progress Notes (Signed)
   Subjective:    Jasmine Gomez is a [id number] [redacted]w[redacted]d being seen today for her first obstetrical visit.  Her obstetrical history is significant for advanced maternal age. Patient does intend to breast feed. Pregnancy history fully reviewed.  Patient reports no complaints.  Filed Vitals:   01/10/13 1551  BP: 111/72  Weight: 159 lb (72.122 kg)    HISTORY: OB History  Gravida Para Term Preterm AB SAB TAB Ectopic Multiple Living  3 2 2       2     # Outcome Date GA Lbr Len/2nd Weight Sex Delivery Anes PTL Lv  3 CUR           2 TRM 08/26/07 [redacted]w[redacted]d  8 lb 6 oz (3.799 kg) M SVD EPI N Y  1 TRM 09/18/05 [redacted]w[redacted]d  8 lb 2 oz (3.685 kg) M SVD EPI N Y     Past Medical History  Diagnosis Date  . Family history of breast cancer 11/16/2011    Mother and sister with  BrCa.  Patient is neg for BrCa gene.      Past Surgical History  Procedure Laterality Date  . Nasal sinus surgery  1989  . Tonsillectomy  1988   Family History  Problem Relation Age of Onset  . Breast cancer Mother   . Hyperlipidemia Father   . Breast cancer Sister      Exam    Uterus:     Pelvic Exam:    Perineum: No Hemorrhoids   Vulva: normal   Vagina:  normal mucosa   pH: n/a   Cervix: no lesions   Adnexa: normal adnexa   Bony Pelvis: proven to 8 1/2 pounds  System: Breast:  normal appearance, no masses or tenderness   Skin: normal coloration and turgor, no rashes    Neurologic: oriented, normal   Extremities: no deformities   HEENT sclera clear, anicteric   Mouth/Teeth mucous membranes moist, pharynx normal without lesions and dental hygiene good   Neck supple and no masses   Cardiovascular: regular rate and rhythm   Respiratory:  chest clear, no wheezing, crepitations, rhonchi, normal symmetric air entry   Abdomen: soft, non-tender; bowel sounds normal; no masses,  no organomegaly   Urinary: urethral meatus normal      Assessment:    Pregnancy: [id number] Patient Active Problem List   Diagnosis Date  Noted  . Family history of breast cancer 11/16/2011  . Migraine with aura 11/16/2011  . Allergic rhinitis, cause unspecified 09/05/2010        Plan:     Initial labs drawn. Prenatal vitamins. Problem list reviewed and updated. Genetic Screening discussed and declines all testing  Ultrasound discussed; fetal survey: requested.  Follow up in 4 weeks. Bedside US confirms LMP dating.   Jasmine Gomez H. 01/10/2013

## 2013-01-11 LAB — PRESCRIPTION MONITORING PROFILE (19 PANEL)
Barbiturate Screen, Urine: NEGATIVE ng/mL
Benzodiazepine Screen, Urine: NEGATIVE ng/mL
Cannabinoid Scrn, Ur: NEGATIVE ng/mL
Carisoprodol, Urine: NEGATIVE ng/mL
Cocaine Metabolites: NEGATIVE ng/mL
Fentanyl, Ur: NEGATIVE ng/mL
Nitrites, Initial: NEGATIVE ug/mL
Opiate Screen, Urine: NEGATIVE ng/mL
Phencyclidine, Ur: NEGATIVE ng/mL
Propoxyphene: NEGATIVE ng/mL
Tapentadol, urine: NEGATIVE ng/mL
Tramadol Scrn, Ur: NEGATIVE ng/mL
Zolpidem, Urine: NEGATIVE ng/mL

## 2013-01-11 LAB — OBSTETRIC PANEL
Basophils Absolute: 0 10*3/uL (ref 0.0–0.1)
Basophils Relative: 0 % (ref 0–1)
Eosinophils Absolute: 0.1 10*3/uL (ref 0.0–0.7)
Eosinophils Relative: 1 % (ref 0–5)
Hepatitis B Surface Ag: NEGATIVE
Lymphs Abs: 3 10*3/uL (ref 0.7–4.0)
MCH: 32.1 pg (ref 26.0–34.0)
MCV: 92.3 fL (ref 78.0–100.0)
Neutrophils Relative %: 67 % (ref 43–77)
Platelets: 366 10*3/uL (ref 150–400)
RBC: 4.27 MIL/uL (ref 3.87–5.11)
RDW: 13.3 % (ref 11.5–15.5)

## 2013-01-11 LAB — HIV ANTIBODY (ROUTINE TESTING W REFLEX): HIV: NONREACTIVE

## 2013-01-11 LAB — CULTURE, URINE COMPREHENSIVE
Colony Count: NO GROWTH
Organism ID, Bacteria: NO GROWTH

## 2013-01-12 ENCOUNTER — Encounter: Payer: Self-pay | Admitting: Obstetrics & Gynecology

## 2013-01-12 DIAGNOSIS — O26891 Other specified pregnancy related conditions, first trimester: Secondary | ICD-10-CM

## 2013-01-12 DIAGNOSIS — Z6791 Unspecified blood type, Rh negative: Secondary | ICD-10-CM | POA: Insufficient documentation

## 2013-01-26 NOTE — L&D Delivery Note (Signed)
I was present for delivery and agree with note above. Venia Carbon Michiel Cowboy, CNM

## 2013-01-26 NOTE — L&D Delivery Note (Signed)
Delivery Note At 2210 a viable Female was delivered via Spontaneous vaginal delivery (Presentation: Vertex ;  ).  Placenta status: Intact .  Cord: 3 vessel with the following complications:   Anesthesia:  Epidural Episiotomy: None Lacerations: None Suture Repair: none Est. Blood Loss (mL):  150  Mom to postpartum.  Baby to Nursery.  Called to bedside when patient was noted to be complete and pushing. After several brief pushes, head was delivered OA over an intact perineum. There was a loose nuchal cord that was easily reduced. Shoulders and body were extracted from the vagina without difficulty. Baby was vigorous and handed to mom for skin to skin. After 2 minutes, cord was clamped and cut. Vaginal bleeding was minimal. With gentle traction, placenta was removed without difficulty. No vaginal lacerations were noted after examination of the vagina and cervix. Uterus was firm and was given brief fundal massage. Bleeding was controlled afterwards. Needle and sponge count were completed and were correct. Mom and baby left in the room in good condition. Mom and baby ready for transfer to postpartum.  Delivery completed with Janann Colonel.  Toney Reil A 08/14/2013, 10:18 PM

## 2013-02-09 ENCOUNTER — Ambulatory Visit (INDEPENDENT_AMBULATORY_CARE_PROVIDER_SITE_OTHER): Payer: BC Managed Care – PPO | Admitting: Obstetrics & Gynecology

## 2013-02-09 VITALS — BP 98/65 | Wt 161.0 lb

## 2013-02-09 DIAGNOSIS — O09529 Supervision of elderly multigravida, unspecified trimester: Secondary | ICD-10-CM

## 2013-02-09 NOTE — Progress Notes (Signed)
Pt feeling better.  Anatomy at 19 weeks.  Refused genetic testing.  Discussed BTL and vasectomy.

## 2013-02-09 NOTE — Progress Notes (Signed)
p-79

## 2013-03-09 ENCOUNTER — Ambulatory Visit (INDEPENDENT_AMBULATORY_CARE_PROVIDER_SITE_OTHER): Payer: BC Managed Care – PPO | Admitting: Obstetrics & Gynecology

## 2013-03-09 ENCOUNTER — Encounter: Payer: Self-pay | Admitting: Obstetrics & Gynecology

## 2013-03-09 VITALS — BP 113/74 | Wt 166.0 lb

## 2013-03-09 DIAGNOSIS — O09529 Supervision of elderly multigravida, unspecified trimester: Secondary | ICD-10-CM

## 2013-03-09 DIAGNOSIS — O09522 Supervision of elderly multigravida, second trimester: Secondary | ICD-10-CM

## 2013-03-09 NOTE — Progress Notes (Signed)
P - 74

## 2013-03-09 NOTE — Progress Notes (Signed)
p-74  Everyone in family sick with the flu.  Pt is asymtpmatic

## 2013-03-09 NOTE — Progress Notes (Signed)
No problems.  Anatomy US ordered

## 2013-03-29 ENCOUNTER — Ambulatory Visit (HOSPITAL_COMMUNITY)
Admission: RE | Admit: 2013-03-29 | Discharge: 2013-03-29 | Disposition: A | Discharge status: Home / Self Care | Payer: BC Managed Care – PPO | Source: Ambulatory Visit | Attending: Obstetrics & Gynecology | Admitting: Obstetrics & Gynecology

## 2013-03-29 DIAGNOSIS — Z1389 Encounter for screening for other disorder: Secondary | ICD-10-CM | POA: Insufficient documentation

## 2013-03-29 DIAGNOSIS — O358XX0 Maternal care for other (suspected) fetal abnormality and damage, not applicable or unspecified: Secondary | ICD-10-CM | POA: Insufficient documentation

## 2013-03-29 DIAGNOSIS — O09529 Supervision of elderly multigravida, unspecified trimester: Secondary | ICD-10-CM | POA: Insufficient documentation

## 2013-03-29 DIAGNOSIS — O09522 Supervision of elderly multigravida, second trimester: Secondary | ICD-10-CM

## 2013-03-29 DIAGNOSIS — Z363 Encounter for antenatal screening for malformations: Secondary | ICD-10-CM | POA: Insufficient documentation

## 2013-04-06 ENCOUNTER — Ambulatory Visit (INDEPENDENT_AMBULATORY_CARE_PROVIDER_SITE_OTHER): Payer: BC Managed Care – PPO | Admitting: Obstetrics and Gynecology

## 2013-04-06 ENCOUNTER — Encounter: Payer: Self-pay | Admitting: Obstetrics and Gynecology

## 2013-04-06 VITALS — BP 114/72

## 2013-04-06 DIAGNOSIS — O09522 Supervision of elderly multigravida, second trimester: Secondary | ICD-10-CM

## 2013-04-06 DIAGNOSIS — O09529 Supervision of elderly multigravida, unspecified trimester: Secondary | ICD-10-CM

## 2013-04-06 NOTE — Progress Notes (Signed)
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 Concerned she may get sciatica as in last pregnancy. Advised back exercises.   [place] reviewed > schedule F/U anatomy next visit.

## 2013-04-06 NOTE — Patient Instructions (Addendum)
Second Trimester of Pregnancy The second trimester is from week 13 through week 28, months 4 through 6. The second trimester is often a time when you feel your best. Your body has also adjusted to being pregnant, and you begin to feel better physically. Usually, morning sickness has lessened or quit completely, you may have more energy, and you may have an increase in appetite. The second trimester is also a time when the fetus is growing rapidly. At the end of the sixth month, the fetus is about 9 inches long and weighs about 1 pounds. You will likely begin to feel the baby move (quickening) between 18 and 20 weeks of the pregnancy. BODY CHANGES Your body goes through many changes during pregnancy. The changes vary from woman to woman.   Your weight will continue to increase. You will notice your lower abdomen bulging out.  You may begin to get stretch marks on your hips, abdomen, and breasts.  You may develop headaches that can be relieved by medicines approved by your caregiver.  You may urinate more often because the fetus is pressing on your bladder.  You may develop or continue to have heartburn as a result of your pregnancy.  You may develop constipation because certain hormones are causing the muscles that push waste through your intestines to slow down.  You may develop hemorrhoids or swollen, bulging veins (varicose veins).  You may have back pain because of the weight gain and pregnancy hormones relaxing your joints between the bones in your pelvis and as a result of a shift in weight and the muscles that support your balance.  Your breasts will continue to grow and be tender.  Your gums may bleed and may be sensitive to brushing and flossing.  Dark spots or blotches (chloasma, mask of pregnancy) may develop on your face. This will likely fade after the baby is born.  A dark line from your belly button to the pubic area (linea nigra) may appear. This will likely fade after the  baby is born. WHAT TO EXPECT AT YOUR PRENATAL VISITS During a routine prenatal visit:  You will be weighed to make sure you and the fetus are growing normally.  Your blood pressure will be taken.  Your abdomen will be measured to track your baby's growth.  The fetal heartbeat will be listened to.  Any test results from the previous visit will be discussed. Your caregiver may ask you:  How you are feeling.  If you are feeling the baby move.  If you have had any abnormal symptoms, such as leaking fluid, bleeding, severe headaches, or abdominal cramping.  If you have any questions. Other tests that may be performed during your second trimester include:  Blood tests that check for:  Low iron levels (anemia).  Gestational diabetes (between 24 and 28 weeks).  Rh antibodies.  Urine tests to check for infections, diabetes, or protein in the urine.  An ultrasound to confirm the proper growth and development of the baby.  An amniocentesis to check for possible genetic problems.  Fetal screens for spina bifida and Down syndrome. HOME CARE INSTRUCTIONS   Avoid all smoking, herbs, alcohol, and unprescribed drugs. These chemicals affect the formation and growth of the baby.  Follow your caregiver's instructions regarding medicine use. There are medicines that are either safe or unsafe to take during pregnancy.  Exercise only as directed by your caregiver. Experiencing uterine cramps is a good sign to stop exercising.  Continue to eat regular,   healthy meals.  Wear a good support bra for breast tenderness.  Do not use hot tubs, steam rooms, or saunas.  Wear your seat belt at all times when driving.  Avoid raw meat, uncooked cheese, cat litter boxes, and soil used by cats. These carry germs that can cause birth defects in the baby.  Take your prenatal vitamins.  Try taking a stool softener (if your caregiver approves) if you develop constipation. Eat more high-fiber foods,  such as fresh vegetables or fruit and whole grains. Drink plenty of fluids to keep your urine clear or pale yellow.  Take warm sitz baths to soothe any pain or discomfort caused by hemorrhoids. Use hemorrhoid cream if your caregiver approves.  If you develop varicose veins, wear support hose. Elevate your feet for 15 minutes, 3 4 times a day. Limit salt in your diet.  Avoid heavy lifting, wear low heel shoes, and practice good posture.  Rest with your legs elevated if you have leg cramps or low back pain.  Visit your dentist if you have not gone yet during your pregnancy. Use a soft toothbrush to brush your teeth and be gentle when you floss.  A sexual relationship may be continued unless your caregiver directs you otherwise.  Continue to go to all your prenatal visits as directed by your caregiver. SEEK MEDICAL CARE IF:   You have dizziness.  You have mild pelvic cramps, pelvic pressure, or nagging pain in the abdominal area.  You have persistent nausea, vomiting, or diarrhea.  You have a bad smelling vaginal discharge.  You have pain with urination. SEEK IMMEDIATE MEDICAL CARE IF:   You have a fever.  You are leaking fluid from your vagina.  You have spotting or bleeding from your vagina.  You have severe abdominal cramping or pain.  You have rapid weight gain or loss.  You have shortness of breath with chest pain.  You notice sudden or extreme swelling of your face, hands, ankles, feet, or legs.  You have not felt your baby move in over an hour.  You have severe headaches that do not go away with medicine.  You have vision changes. Document Released: 01/06/2001 Document Revised: 09/14/2012 Document Reviewed: 03/15/2012 [facility] Patient Information 2014 [place]. Back Exercises Back exercises help treat and prevent back injuries. The goal of back exercises is to increase the strength of your abdominal and back muscles and the flexibility of your back. These  exercises should be started when you no longer have back pain. Back exercises include:  Pelvic Tilt. Lie on your back with your knees bent. Tilt your pelvis until the lower part of your back is against the floor. Hold this position 5 to 10 sec and repeat 5 to 10 times.  Knee to Chest. Pull first 1 knee up against your chest and hold for 20 to 30 seconds, repeat this with the other knee, and then both knees. This may be done with the other leg straight or bent, whichever feels better.  Sit-Ups or Curl-Ups. Bend your knees 90 degrees. Start with tilting your pelvis, and do a partial, slow sit-up, lifting your trunk only 30 to 45 degrees off the floor. Take at least 2 to 3 seconds for each sit-up. Do not do sit-ups with your knees out straight. If partial sit-ups are difficult, simply do the above but with only tightening your abdominal muscles and holding it as directed.  Hip-Lift. Lie on your back with your knees flexed 90 degrees. Push down  with your feet and shoulders as you raise your hips a couple inches off the floor; hold for 10 seconds, repeat 5 to 10 times.  Back arches. Lie on your stomach, propping yourself up on bent elbows. Slowly press on your hands, causing an arch in your low back. Repeat 3 to 5 times. Any initial stiffness and discomfort should lessen with repetition over time.  Shoulder-Lifts. Lie face down with arms beside your body. Keep hips and torso pressed to floor as you slowly lift your head and shoulders off the floor. Do not overdo your exercises, especially in the beginning. Exercises may cause you some mild back discomfort which lasts for a few minutes; however, if the pain is more severe, or lasts for more than 15 minutes, do not continue exercises until you see your caregiver. Improvement with exercise therapy for back problems is slow.  See your caregivers for assistance with developing a proper back exercise program. Document Released: 02/20/2004 Document Revised:  04/06/2011 Document Reviewed: 11/13/2010 [facility] Patient Information 2014 [place].

## 2013-04-06 NOTE — Progress Notes (Signed)
p-84

## 2013-05-04 ENCOUNTER — Encounter: Payer: Self-pay | Admitting: Obstetrics & Gynecology

## 2013-05-04 ENCOUNTER — Ambulatory Visit (INDEPENDENT_AMBULATORY_CARE_PROVIDER_SITE_OTHER): Payer: BC Managed Care – PPO | Admitting: Obstetrics & Gynecology

## 2013-05-04 VITALS — BP 118/72 | Wt 177.0 lb

## 2013-05-04 DIAGNOSIS — Z6791 Unspecified blood type, Rh negative: Secondary | ICD-10-CM

## 2013-05-04 DIAGNOSIS — O09529 Supervision of elderly multigravida, unspecified trimester: Secondary | ICD-10-CM

## 2013-05-04 DIAGNOSIS — O26891 Other specified pregnancy related conditions, first trimester: Secondary | ICD-10-CM

## 2013-05-04 DIAGNOSIS — O360990 Maternal care for other rhesus isoimmunization, unspecified trimester, not applicable or unspecified: Secondary | ICD-10-CM

## 2013-05-04 NOTE — Progress Notes (Signed)
p-81

## 2013-05-04 NOTE — Patient Instructions (Signed)
Return to clinic for any obstetric concerns or go to MAU for evaluation  

## 2013-05-04 NOTE — Progress Notes (Signed)
1 hr GTT, Tdap, Rhogam and labs next visit.  No other complaints or concerns.  Fetal movement and labor precautions reviewed.

## 2013-06-01 ENCOUNTER — Ambulatory Visit (INDEPENDENT_AMBULATORY_CARE_PROVIDER_SITE_OTHER): Payer: BC Managed Care – PPO | Admitting: Obstetrics & Gynecology

## 2013-06-01 VITALS — BP 114/78 | HR 88 | Wt 180.0 lb

## 2013-06-01 DIAGNOSIS — Z6791 Unspecified blood type, Rh negative: Secondary | ICD-10-CM

## 2013-06-01 DIAGNOSIS — O26891 Other specified pregnancy related conditions, first trimester: Secondary | ICD-10-CM

## 2013-06-01 DIAGNOSIS — Z1389 Encounter for screening for other disorder: Secondary | ICD-10-CM

## 2013-06-01 DIAGNOSIS — IMO0002 Reserved for concepts with insufficient information to code with codable children: Secondary | ICD-10-CM

## 2013-06-01 DIAGNOSIS — O09529 Supervision of elderly multigravida, unspecified trimester: Secondary | ICD-10-CM

## 2013-06-01 DIAGNOSIS — Z3480 Encounter for supervision of other normal pregnancy, unspecified trimester: Secondary | ICD-10-CM

## 2013-06-01 DIAGNOSIS — Z0489 Encounter for examination and observation for other specified reasons: Secondary | ICD-10-CM

## 2013-06-01 DIAGNOSIS — O360990 Maternal care for other rhesus isoimmunization, unspecified trimester, not applicable or unspecified: Secondary | ICD-10-CM

## 2013-06-01 DIAGNOSIS — Z23 Encounter for immunization: Secondary | ICD-10-CM

## 2013-06-01 MED ORDER — TETANUS-DIPHTH-ACELL PERTUSSIS 5-2.5-18.5 LF-MCG/0.5 IM SUSP
0.5000 mL | Freq: Once | INTRAMUSCULAR | Status: AC
  Administered 2013-06-01: 0.5 mL via INTRAMUSCULAR

## 2013-06-01 MED ORDER — RHO D IMMUNE GLOBULIN 1500 UNIT/2ML IJ SOSY
300.0000 ug | PREFILLED_SYRINGE | Freq: Once | INTRAMUSCULAR | Status: AC
  Administered 2013-06-01: 300 ug via INTRAMUSCULAR

## 2013-06-01 NOTE — Progress Notes (Signed)
28 week labs today.  

## 2013-06-01 NOTE — Patient Instructions (Signed)
Return to clinic for any obstetric concerns or go to MAU for evaluation  

## 2013-06-01 NOTE — Progress Notes (Addendum)
Third trimester labs today; will also receive Rhogam and Tdap.   Follow up scan ordered given limited views of kidneys and spine on anatomy scan. No other complaints or concerns.  Routine obstetric precautions reviewed.

## 2013-06-02 ENCOUNTER — Encounter: Payer: Self-pay | Admitting: Obstetrics & Gynecology

## 2013-06-02 ENCOUNTER — Encounter: Payer: Self-pay | Admitting: *Deleted

## 2013-06-02 LAB — CBC
HCT: 34.5 % — ABNORMAL LOW (ref 36.0–46.0)
HEMOGLOBIN: 11.9 g/dL — AB (ref 12.0–15.0)
MCH: 32.3 pg (ref 26.0–34.0)
MCHC: 34.5 g/dL (ref 30.0–36.0)
MCV: 93.8 fL (ref 78.0–100.0)
Platelets: 291 10*3/uL (ref 150–400)
RBC: 3.68 MIL/uL — ABNORMAL LOW (ref 3.87–5.11)
RDW: 13.5 % (ref 11.5–15.5)
WBC: 13.5 10*3/uL — ABNORMAL HIGH (ref 4.0–10.5)

## 2013-06-02 LAB — RPR

## 2013-06-02 LAB — GLUCOSE TOLERANCE, 1 HOUR (50G) W/O FASTING: Glucose, 1 Hour GTT: 114 mg/dL (ref 70–140)

## 2013-06-02 LAB — HIV ANTIBODY (ROUTINE TESTING W REFLEX): HIV 1&2 Ab, 4th Generation: NONREACTIVE

## 2013-06-02 LAB — ANTIBODY SCREEN: Antibody Screen: NEGATIVE

## 2013-06-02 NOTE — Addendum Note (Signed)
Addended by: Verita Schneiders A on: 06/02/2013 08:28 AM   Modules accepted: Orders

## 2013-06-05 ENCOUNTER — Telehealth: Payer: Self-pay | Admitting: *Deleted

## 2013-06-05 NOTE — Telephone Encounter (Signed)
LM on voicemail that 28 week labs were WNL

## 2013-06-15 ENCOUNTER — Ambulatory Visit (INDEPENDENT_AMBULATORY_CARE_PROVIDER_SITE_OTHER): Payer: BC Managed Care – PPO | Admitting: Obstetrics & Gynecology

## 2013-06-15 ENCOUNTER — Encounter: Payer: Self-pay | Admitting: *Deleted

## 2013-06-15 VITALS — BP 113/77 | HR 78 | Wt 181.0 lb

## 2013-06-15 DIAGNOSIS — O09529 Supervision of elderly multigravida, unspecified trimester: Secondary | ICD-10-CM

## 2013-06-15 MED ORDER — BUDESONIDE 32 MCG/ACT NA SUSP: 1.0000 | Freq: Every day | NASAL | Status: DC

## 2013-06-15 NOTE — Progress Notes (Signed)
Needs Rhinocort for seasonal rhinitis.  No other issues.

## 2013-06-29 ENCOUNTER — Ambulatory Visit (INDEPENDENT_AMBULATORY_CARE_PROVIDER_SITE_OTHER): Payer: BC Managed Care – PPO | Admitting: Obstetrics & Gynecology

## 2013-06-29 VITALS — BP 107/76 | HR 71 | Wt 184.0 lb

## 2013-06-29 DIAGNOSIS — O09529 Supervision of elderly multigravida, unspecified trimester: Secondary | ICD-10-CM

## 2013-06-29 NOTE — Progress Notes (Signed)
Routine visit. Good FM. No problems.  

## 2013-07-13 ENCOUNTER — Ambulatory Visit (INDEPENDENT_AMBULATORY_CARE_PROVIDER_SITE_OTHER): Payer: BC Managed Care – PPO | Admitting: Obstetrics & Gynecology

## 2013-07-13 ENCOUNTER — Encounter: Payer: Self-pay | Admitting: Obstetrics & Gynecology

## 2013-07-13 VITALS — BP 114/76 | HR 81 | Wt 185.0 lb

## 2013-07-13 DIAGNOSIS — O09529 Supervision of elderly multigravida, unspecified trimester: Secondary | ICD-10-CM

## 2013-07-13 NOTE — Progress Notes (Signed)
Routine visit. Good FM. No problems. Cultures at next visit 

## 2013-07-25 ENCOUNTER — Ambulatory Visit (INDEPENDENT_AMBULATORY_CARE_PROVIDER_SITE_OTHER): Payer: BC Managed Care – PPO | Admitting: Obstetrics & Gynecology

## 2013-07-25 VITALS — BP 118/77 | HR 82 | Wt 187.0 lb

## 2013-07-25 DIAGNOSIS — Z3480 Encounter for supervision of other normal pregnancy, unspecified trimester: Secondary | ICD-10-CM

## 2013-07-25 DIAGNOSIS — Z3483 Encounter for supervision of other normal pregnancy, third trimester: Secondary | ICD-10-CM

## 2013-07-25 LAB — OB RESULTS CONSOLE GC/CHLAMYDIA
Chlamydia: NEGATIVE
Gonorrhea: NEGATIVE

## 2013-07-25 NOTE — Progress Notes (Signed)
Cultures today.  Vertex.  NO problems.  Small vulvar varicosities.

## 2013-07-26 LAB — GC/CHLAMYDIA PROBE AMP
CT Probe RNA: NEGATIVE
GC Probe RNA: NEGATIVE

## 2013-07-26 LAB — CULTURE, BETA STREP (GROUP B ONLY)

## 2013-08-01 ENCOUNTER — Encounter: Payer: Self-pay | Admitting: Obstetrics & Gynecology

## 2013-08-03 ENCOUNTER — Ambulatory Visit (INDEPENDENT_AMBULATORY_CARE_PROVIDER_SITE_OTHER): Payer: BC Managed Care – PPO | Admitting: Obstetrics & Gynecology

## 2013-08-03 VITALS — BP 122/75 | HR 82 | Wt 186.0 lb

## 2013-08-03 DIAGNOSIS — O09523 Supervision of elderly multigravida, third trimester: Secondary | ICD-10-CM

## 2013-08-03 DIAGNOSIS — O09529 Supervision of elderly multigravida, unspecified trimester: Secondary | ICD-10-CM

## 2013-08-03 NOTE — Progress Notes (Signed)
Routine visit. Good FM. No problems. +GBS. Discussed the need for ABX in labor. Labor precautions reviewed.

## 2013-08-10 ENCOUNTER — Ambulatory Visit (INDEPENDENT_AMBULATORY_CARE_PROVIDER_SITE_OTHER): Payer: BC Managed Care – PPO | Admitting: Obstetrics & Gynecology

## 2013-08-10 ENCOUNTER — Encounter: Payer: Self-pay | Admitting: Obstetrics & Gynecology

## 2013-08-10 VITALS — BP 122/79 | HR 82 | Wt 187.0 lb

## 2013-08-10 DIAGNOSIS — Z3480 Encounter for supervision of other normal pregnancy, unspecified trimester: Secondary | ICD-10-CM

## 2013-08-10 DIAGNOSIS — O09523 Supervision of elderly multigravida, third trimester: Secondary | ICD-10-CM

## 2013-08-10 NOTE — Progress Notes (Signed)
Increase Migraine

## 2013-08-10 NOTE — Progress Notes (Signed)
Routine visit. She reports migraines. I offered imitrex but she declines and says that Tylenol is working. Good FM. Labor precautions reviewed.

## 2013-08-12 ENCOUNTER — Encounter (HOSPITAL_COMMUNITY): Payer: Self-pay

## 2013-08-12 ENCOUNTER — Inpatient Hospital Stay (HOSPITAL_COMMUNITY)
Admission: AD | Admit: 2013-08-12 | Discharge: 2013-08-12 | Disposition: A | Discharge status: Home / Self Care | Payer: BC Managed Care – PPO | Source: Ambulatory Visit | Attending: Obstetrics & Gynecology | Admitting: Obstetrics & Gynecology

## 2013-08-12 DIAGNOSIS — O09523 Supervision of elderly multigravida, third trimester: Secondary | ICD-10-CM

## 2013-08-12 DIAGNOSIS — Z87891 Personal history of nicotine dependence: Secondary | ICD-10-CM | POA: Insufficient documentation

## 2013-08-12 DIAGNOSIS — Z803 Family history of malignant neoplasm of breast: Secondary | ICD-10-CM | POA: Insufficient documentation

## 2013-08-12 DIAGNOSIS — O479 False labor, unspecified: Secondary | ICD-10-CM | POA: Insufficient documentation

## 2013-08-12 LAB — OB RESULTS CONSOLE GBS: STREP GROUP B AG: POSITIVE

## 2013-08-12 NOTE — MAU Provider Note (Signed)
History     CSN: [id number]  Arrival date and time: 08/12/13 1821   None     Chief Complaint  Patient presents with   Labor Eval   HPI  Thinks she is in labor, contractions since this morning, noticed leakage of fluid starting when she arrived to MAU. Reports being very active today, went for walk.  +FM, no VB  Past Medical History  Diagnosis Date   Family history of breast cancer 11/16/2011    Mother and sister with  BrCa.  Patient is neg for BrCa gene.       Past Surgical History  Procedure Laterality Date   Nasal sinus surgery  1989   Tonsillectomy  1988    Family History  Problem Relation Age of Onset   Breast cancer Mother    Hyperlipidemia Father    Breast cancer Sister     History  Substance Use Topics   Smoking status: Former Smoker -- 10 years   Smokeless tobacco: Not on file   Alcohol Use: 1.2 oz/week    2 Glasses of wine per week    Allergies: No Known Allergies  Prescriptions prior to admission  Medication Sig Dispense Refill   acetaminophen (TYLENOL) 500 MG tablet Take 1,000 mg by mouth every 6 (six) hours as needed for headache.       budesonide (RHINOCORT AQUA) 32 MCG/ACT nasal spray Place 1 spray into both nostrils daily as needed for rhinitis.       Prenatal Vit-Fe Fumarate-FA (PRENATAL MULTIVITAMIN) TABS tablet Take 1 tablet by mouth daily.       ranitidine (ZANTAC) 150 MG tablet Take 150 mg by mouth daily.         Review of Systems  Constitutional: Negative for fever, chills and weight loss.  Respiratory: Negative for cough and hemoptysis.   Cardiovascular: Negative for chest pain and orthopnea.  Gastrointestinal: Negative for nausea and vomiting.  Genitourinary: Negative for dysuria and urgency.       +leaking fluid  Musculoskeletal: Negative for back pain and neck pain.  Neurological: Negative for dizziness and tingling.  Psychiatric/Behavioral: Negative for depression and suicidal ideas.   Physical Exam   Blood  pressure 132/82, pulse 84, temperature 98.5 F (36.9 C), temperature source Oral, resp. rate 20, height $RemoveBe'5\' 4"'jLuSOhcjj$  (1.626 m), weight 84.823 kg (187 lb), last menstrual period 11/12/2012.  Dilation: 3.5 Effacement (%): 70 Cervical Position: Anterior Station: -2 Presentation: Vertex Exam by:: Glenford Peers RN  Physical Exam  Constitutional: She appears well-developed and well-nourished. No distress.  Cardiovascular: Regular rhythm and normal heart sounds.   Respiratory: No respiratory distress.  GI: She exhibits no distension. There is no tenderness.  Genitourinary: Vagina normal.  Sterile spec exam normal, no fluid with valsalva, fern test neg  Musculoskeletal: Normal range of motion. She exhibits no edema and no tenderness.  Neurological: She is alert.  Skin: Skin is warm and dry. No erythema.   Baseline 140, + accels, no decels, reactive NST  MAU Course  Procedures  MDM Sterile spec exam neg Fern test negative NST reactive Assessment and Plan   Patient is to ambulate for 1 hour and then will return for labor check, last exam 1 week ago pt was dilated to 2.5.  If no change in SVE will discharge.  Jasmine Gomez 08/12/2013, 8:19 PM

## 2013-08-12 NOTE — Progress Notes (Signed)
Dr Minda Ditto notified of pt's sve after walking. FHR reactive. Pt stable for d/c home

## 2013-08-12 NOTE — Discharge Instructions (Signed)
Braxton Hicks Contractions °Contractions of the uterus can occur throughout pregnancy. Contractions are not always a sign that you are in labor.  °WHAT ARE BRAXTON HICKS CONTRACTIONS?  °Contractions that occur before labor are called Braxton Hicks contractions, or false labor. Toward the end of pregnancy (32-34 weeks), these contractions can develop more often and may become more forceful. This is not true labor because these contractions do not result in opening (dilatation) and thinning of the cervix. They are sometimes difficult to tell apart from true labor because these contractions can be forceful and people have different pain tolerances. You should not feel embarrassed if you go to the hospital with false labor. Sometimes, the only way to tell if you are in true labor is for your health care provider to look for changes in the cervix. °If there are no prenatal problems or other health problems associated with the pregnancy, it is completely safe to be sent home with false labor and await the onset of true labor. °HOW CAN YOU TELL THE DIFFERENCE BETWEEN TRUE AND FALSE LABOR? °False Labor °· The contractions of false labor are usually shorter and not as hard as those of true labor.   °· The contractions are usually irregular.   °· The contractions are often felt in the front of the lower abdomen and in the groin.   °· The contractions may go away when you walk around or change positions while lying down.   °· The contractions get weaker and are shorter lasting as time goes on.   °· The contractions do not usually become progressively stronger, regular, and closer together as with true labor.   °True Labor °· Contractions in true labor last 30-70 seconds, become very regular, usually become more intense, and increase in frequency.   °· The contractions do not go away with walking.   °· The discomfort is usually felt in the top of the uterus and spreads to the lower abdomen and low back.   °· True labor can be  determined by your health care provider with an exam. This will show that the cervix is dilating and getting thinner.   °WHAT TO REMEMBER °· Keep up with your usual exercises and follow other instructions given by your health care provider.   °· Take medicines as directed by your health care provider.   °· Keep your regular prenatal appointments.   °· Eat and drink lightly if you think you are going into labor.   °· If Braxton Hicks contractions are making you uncomfortable:   °¨ Change your position from lying down or resting to walking, or from walking to resting.   °¨ Sit and rest in a tub of warm water.   °¨ Drink 2-3 glasses of water. Dehydration may cause these contractions.   °¨ Do slow and deep breathing several times an hour.   °WHEN SHOULD I SEEK IMMEDIATE MEDICAL CARE? °Seek immediate medical care if: °· Your contractions become stronger, more regular, and closer together.   °· You have fluid leaking or gushing from your vagina.   °· You have a fever.   °· You pass blood-tinged mucus.   °· You have vaginal bleeding.   °· You have continuous abdominal pain.   °· You have low back pain that you never had before.   °· You feel your baby's head pushing down and causing pelvic pressure.   °· Your baby is not moving as much as it used to.   °Document Released: 01/12/2005 Document Revised: 01/17/2013 Document Reviewed: 10/24/2012 °[employer]® Patient Information ©2015 [employer]. This information is not intended to replace advice given to you by your health care   provider. Make sure you discuss any questions you have with your health care provider. ° °

## 2013-08-12 NOTE — MAU Note (Signed)
Ctx every 6 minutes, worse in the past hour. Some bloody show. Denies LOF. Positive fetal movement.

## 2013-08-12 NOTE — Progress Notes (Signed)
Written and verbal d/c instructions given and understanding voiced. 

## 2013-08-12 NOTE — MAU Provider Note (Signed)
Attestation of Attending Supervision of Fellow: Evaluation and management procedures were performed by the Fellow under my supervision and collaboration.  I have reviewed the Fellow's note and chart, and I agree with the management and plan.    

## 2013-08-14 ENCOUNTER — Encounter (HOSPITAL_COMMUNITY): Payer: BC Managed Care – PPO | Admitting: Anesthesiology

## 2013-08-14 ENCOUNTER — Inpatient Hospital Stay (HOSPITAL_COMMUNITY)
Admission: AD | Admit: 2013-08-14 | Discharge: 2013-08-16 | DRG: 775 | Disposition: A | Discharge status: Home / Self Care | Payer: BC Managed Care – PPO | Source: Ambulatory Visit | Attending: Obstetrics & Gynecology | Admitting: Obstetrics & Gynecology

## 2013-08-14 ENCOUNTER — Encounter (HOSPITAL_COMMUNITY): Payer: Self-pay

## 2013-08-14 ENCOUNTER — Inpatient Hospital Stay (HOSPITAL_COMMUNITY): Payer: BC Managed Care – PPO | Admitting: Anesthesiology

## 2013-08-14 DIAGNOSIS — O99892 Other specified diseases and conditions complicating childbirth: Secondary | ICD-10-CM | POA: Diagnosis present

## 2013-08-14 DIAGNOSIS — Z87891 Personal history of nicotine dependence: Secondary | ICD-10-CM

## 2013-08-14 DIAGNOSIS — O4290 Premature rupture of membranes, unspecified as to length of time between rupture and onset of labor, unspecified weeks of gestation: Principal | ICD-10-CM | POA: Diagnosis present

## 2013-08-14 DIAGNOSIS — IMO0001 Reserved for inherently not codable concepts without codable children: Secondary | ICD-10-CM

## 2013-08-14 DIAGNOSIS — O09529 Supervision of elderly multigravida, unspecified trimester: Secondary | ICD-10-CM | POA: Diagnosis present

## 2013-08-14 DIAGNOSIS — G43909 Migraine, unspecified, not intractable, without status migrainosus: Secondary | ICD-10-CM | POA: Diagnosis present

## 2013-08-14 DIAGNOSIS — Z803 Family history of malignant neoplasm of breast: Secondary | ICD-10-CM

## 2013-08-14 DIAGNOSIS — Z22330 Carrier of Group B streptococcus: Secondary | ICD-10-CM

## 2013-08-14 DIAGNOSIS — O9989 Other specified diseases and conditions complicating pregnancy, childbirth and the puerperium: Secondary | ICD-10-CM

## 2013-08-14 HISTORY — DX: Migraine, unspecified, not intractable, without status migrainosus: G43.909

## 2013-08-14 HISTORY — DX: Headache: R51

## 2013-08-14 LAB — CBC
HCT: 36 % (ref 36.0–46.0)
HEMOGLOBIN: 12.9 g/dL (ref 12.0–15.0)
MCH: 33.6 pg (ref 26.0–34.0)
MCHC: 35.8 g/dL (ref 30.0–36.0)
MCV: 93.8 fL (ref 78.0–100.0)
Platelets: 233 10*3/uL (ref 150–400)
RBC: 3.84 MIL/uL — ABNORMAL LOW (ref 3.87–5.11)
RDW: 13 % (ref 11.5–15.5)
WBC: 14.7 10*3/uL — ABNORMAL HIGH (ref 4.0–10.5)

## 2013-08-14 LAB — RPR

## 2013-08-14 LAB — AMNISURE RUPTURE OF MEMBRANE (ROM) NOT AT ARMC: Amnisure ROM: POSITIVE

## 2013-08-14 LAB — POCT FERN TEST: POCT Fern Test: NEGATIVE

## 2013-08-14 MED ORDER — ACETAMINOPHEN 325 MG PO TABS: 650.0000 mg | ORAL_TABLET | ORAL | Status: DC | PRN

## 2013-08-14 MED ORDER — EPHEDRINE 5 MG/ML INJ
10.0000 mg | INTRAVENOUS | Status: DC | PRN
  Filled 2013-08-14: qty 2

## 2013-08-14 MED ORDER — LIDOCAINE HCL (PF) 1 % IJ SOLN
INTRAMUSCULAR | Status: DC | PRN
  Administered 2013-08-14: 10 mL

## 2013-08-14 MED ORDER — FENTANYL 2.5 MCG/ML BUPIVACAINE 1/10 % EPIDURAL INFUSION (WH - ANES)
14.0000 mL/h | INTRAMUSCULAR | Status: DC | PRN
  Administered 2013-08-14 (×2): 14 mL/h via EPIDURAL

## 2013-08-14 MED ORDER — LACTATED RINGERS IV SOLN
INTRAVENOUS | Status: DC
  Administered 2013-08-14 (×2): via INTRAVENOUS

## 2013-08-14 MED ORDER — PENICILLIN G POTASSIUM 5000000 UNITS IJ SOLR
5.0000 10*6.[IU] | Freq: Once | INTRAVENOUS | Status: AC
  Administered 2013-08-14: 5 10*6.[IU] via INTRAVENOUS
  Filled 2013-08-14: qty 5

## 2013-08-14 MED ORDER — FENTANYL 2.5 MCG/ML BUPIVACAINE 1/10 % EPIDURAL INFUSION (WH - ANES)
14.0000 mL/h | INTRAMUSCULAR | Status: DC | PRN
  Filled 2013-08-14: qty 125

## 2013-08-14 MED ORDER — CITRIC ACID-SODIUM CITRATE 334-500 MG/5ML PO SOLN
30.0000 mL | ORAL | Status: DC | PRN
  Administered 2013-08-14: 30 mL via ORAL
  Filled 2013-08-14: qty 15

## 2013-08-14 MED ORDER — FENTANYL CITRATE 0.05 MG/ML IJ SOLN: 100.0000 ug | INTRAMUSCULAR | Status: DC | PRN

## 2013-08-14 MED ORDER — EPHEDRINE 5 MG/ML INJ
10.0000 mg | INTRAVENOUS | Status: DC | PRN
Start: 2013-08-14 — End: 2013-08-15
  Filled 2013-08-14: qty 2

## 2013-08-14 MED ORDER — OXYTOCIN 40 UNITS IN LACTATED RINGERS INFUSION - SIMPLE MED
1.0000 m[IU]/min | INTRAVENOUS | Status: DC
  Administered 2013-08-14: 1 m[IU]/min via INTRAVENOUS
  Filled 2013-08-14: qty 1000

## 2013-08-14 MED ORDER — PENICILLIN G POTASSIUM 5000000 UNITS IJ SOLR
2.5000 10*6.[IU] | INTRAVENOUS | Status: DC
  Administered 2013-08-14 (×3): 2.5 10*6.[IU] via INTRAVENOUS
  Filled 2013-08-14 (×5): qty 2.5

## 2013-08-14 MED ORDER — PHENYLEPHRINE 40 MCG/ML (10ML) SYRINGE FOR IV PUSH (FOR BLOOD PRESSURE SUPPORT)
80.0000 ug | PREFILLED_SYRINGE | INTRAVENOUS | Status: DC | PRN
  Administered 2013-08-14: 80 ug via INTRAVENOUS
  Filled 2013-08-14: qty 2

## 2013-08-14 MED ORDER — PHENYLEPHRINE 40 MCG/ML (10ML) SYRINGE FOR IV PUSH (FOR BLOOD PRESSURE SUPPORT)
80.0000 ug | PREFILLED_SYRINGE | INTRAVENOUS | Status: DC | PRN
  Administered 2013-08-14: 80 ug via INTRAVENOUS
  Filled 2013-08-14: qty 10
  Filled 2013-08-14: qty 2

## 2013-08-14 MED ORDER — LACTATED RINGERS IV SOLN
500.0000 mL | Freq: Once | INTRAVENOUS | Status: AC
  Administered 2013-08-14: 500 mL via INTRAVENOUS

## 2013-08-14 MED ORDER — OXYTOCIN BOLUS FROM INFUSION
500.0000 mL | INTRAVENOUS | Status: DC
Start: 2013-08-14 — End: 2013-08-15
  Administered 2013-08-14: 500 mL via INTRAVENOUS

## 2013-08-14 MED ORDER — LIDOCAINE HCL (PF) 1 % IJ SOLN
30.0000 mL | INTRAMUSCULAR | Status: DC | PRN
  Filled 2013-08-14: qty 30

## 2013-08-14 MED ORDER — TERBUTALINE SULFATE 1 MG/ML IJ SOLN: 0.2500 mg | Freq: Once | INTRAMUSCULAR | Status: AC | PRN

## 2013-08-14 MED ORDER — LACTATED RINGERS IV SOLN: 500.0000 mL | INTRAVENOUS | Status: DC | PRN

## 2013-08-14 MED ORDER — OXYCODONE-ACETAMINOPHEN 5-325 MG PO TABS: 1.0000 | ORAL_TABLET | ORAL | Status: DC | PRN

## 2013-08-14 MED ORDER — IBUPROFEN 600 MG PO TABS: 600.0000 mg | ORAL_TABLET | Freq: Four times a day (QID) | ORAL | Status: DC | PRN

## 2013-08-14 MED ORDER — OXYTOCIN 40 UNITS IN LACTATED RINGERS INFUSION - SIMPLE MED
62.5000 mL/h | INTRAVENOUS | Status: DC
Start: 2013-08-14 — End: 2013-08-15
  Administered 2013-08-14: 62.5 mL/h via INTRAVENOUS

## 2013-08-14 MED ORDER — DIPHENHYDRAMINE HCL 50 MG/ML IJ SOLN: 12.5000 mg | INTRAMUSCULAR | Status: DC | PRN

## 2013-08-14 MED ORDER — ONDANSETRON HCL 4 MG/2ML IJ SOLN: 4.0000 mg | Freq: Four times a day (QID) | INTRAMUSCULAR | Status: DC | PRN

## 2013-08-14 NOTE — Progress Notes (Signed)
Patient ID: Jasmine Gomez, female   DOB: Mar 11, 1975, 38 y.o.   MRN: [id number] Comfortable with Epidural (got epidural around 1500 hrs)  Is now on Pitocin for Augmentation of labor.   Filed Vitals:   08/14/13 1626 08/14/13 1627 08/14/13 1637 08/14/13 1642  BP:  89/54 86/51 110/73  Pulse: 89 96 99 82  Temp:      TempSrc:      Resp:      Height:      Weight:      SpO2:  100%     FHR reactive UCs q 4 minutes  Dilation: 4 Effacement (%): 80 Cervical Position: Posterior Station: -2;-1 Presentation: Vertex Exam by:: [facility]  Will continue Pitocin per protocol  Expect SVD soon

## 2013-08-14 NOTE — Anesthesia Procedure Notes (Signed)

## 2013-08-14 NOTE — Anesthesia Preprocedure Evaluation (Signed)

## 2013-08-14 NOTE — Progress Notes (Signed)
   Subjective: Pt reports improved pain with contractions with epidural.    Objective: BP 120/79  Pulse 76  Temp(Src) 98.3 F (36.8 C) (Oral)  Resp 20  Ht 5' 3.2" (1.605 m)  Wt 85.639 kg (188 lb 12.8 oz)  BMI 33.24 kg/m2  SpO2 100%  LMP 11/12/2012   Total I/O In: -  Out: 600 [Urine:600]  FHT:  FHR: 130's bpm, variability: moderate,  accelerations:  Present,  decelerations:  Present one decel after contraction with return to baseline.  UC:   regular, every 2-3 minutes SVE:   Dilation: Lip/rim Effacement (%): 100 Station: +1 Exam by:: Reina Fuse, CNM AROM of forebag; large amount of clear fluid  Labs: Lab Results  Component Value Date   WBC 14.7* 08/14/2013   HGB 12.9 08/14/2013   HCT 36.0 08/14/2013   MCV 93.8 08/14/2013   PLT 233 08/14/2013    Assessment / Plan: Augmentation of labor, progressing well  Labor: Progressing normally Preeclampsia:  n/a Fetal Wellbeing:  Category II Pain Control:  Epidural I/D:  GBS pos Anticipated MOD:  NSVD  Kathrine Haddock N 08/14/2013, 9:27 PM

## 2013-08-14 NOTE — Progress Notes (Signed)
Patient ID: Jasmine Gomez, female   DOB: 22-Aug-1975, 38 y.o.   MRN: [id number] States UCs are progressively stronger, but not more frequent  Filed Vitals:   08/14/13 0905 08/14/13 1019 08/14/13 1138 08/14/13 1300  BP: 116/82 119/75 118/82 92/66  Pulse: 80 86 80 100  Temp:  98 F (36.7 C)  98.1 F (36.7 C)  TempSrc:  Oral  Oral  Resp: 20 20 20 20   Height:      Weight:       FHR reactive UCs spaced out, q 4-6 min  Dilation: 4 Effacement (%): 80 Cervical Position: Posterior Station: -2;-1 Presentation: Vertex Exam by:: [facility]  Patient is aware of option for Pitocin, still thinking about it.

## 2013-08-14 NOTE — MAU Note (Signed)
Pt states contractions every 4 mins since 3am. ? LOF. +FM

## 2013-08-14 NOTE — H&P (Signed)
Attestation of Attending Supervision of Advanced Practitioner (CNM/NP): Evaluation and management procedures were performed by the Advanced Practitioner under my supervision and collaboration.  I have reviewed the Advanced Practitioner's note and chart, and I agree with the management and plan.  HARRAWAY-SMITH, Blima Jaimes 11:44 AM

## 2013-08-14 NOTE — H&P (Signed)
Jasmine Gomez is a 38 y.o. female presenting for PROM at term, early labor.  Maternal Medical History:  Reason for admission: Nausea.    OB History   Grav Para Term Preterm Abortions TAB SAB Ect Mult Living   _0 Past Medical History  Diagnosis Date  . Family history of breast cancer 11/16/2011    Mother and sister with  BrCa.  Patient is neg for BrCa gene.     Marland Kitchen Headache(784.0)   . Migraines    Past Surgical History  Procedure Laterality Date  . Nasal sinus surgery  1989  . Tonsillectomy  1988  . Wisdom tooth extraction     Family History: family history includes Breast cancer in her mother and sister; Hyperlipidemia in her father. Social History:  reports that she has quit smoking. She does not have any smokeless tobacco history on file. She reports that she drinks about 1.2 ounces of alcohol per week. She reports that she does not use illicit drugs.   Prenatal Transfer Tool  Maternal Diabetes: No Genetic Screening: Declined Maternal Ultrasounds/Referrals: Normal Fetal Ultrasounds or other Referrals:  None Maternal Substance Abuse:  No Significant Maternal Medications:  None Significant Maternal Lab Results:  None Other Comments:  None  Review of Systems  Constitutional: Negative for fever, chills and malaise/fatigue.  Gastrointestinal: Positive for abdominal pain (mild). Negative for nausea and vomiting.  Neurological: Negative for dizziness and headaches.    Dilation: 3.5 Effacement (%): 70 Station: -2 Exam by:: Maryagnes Amos RN Blood pressure 119/75, pulse 86, temperature 98.2 F (36.8 C), temperature source Oral, resp. rate 20, height 5' 3.2" (1.605 m), weight 85.639 kg (188 lb 12.8 oz), last menstrual period 11/12/2012. Maternal Exam:  Uterine Assessment: Contraction strength is mild.  Contraction frequency is irregular.   Abdomen: Fundal height is 39.   Estimated fetal weight is 8.2.   Fetal presentation: vertex  Introitus: Normal  vulva. Vagina is positive for vaginal discharge.  Ferning test: positive.  Nitrazine test: not done. Amniotic fluid character: clear.  Pelvis: adequate for delivery.   Cervix: Cervix evaluated by digital exam.     Fetal Exam Fetal Monitor Review: Mode: ultrasound.   Baseline rate: 140.  Variability: moderate (6-25 bpm).   Pattern: accelerations present and no decelerations.    Fetal State Assessment: Category I - tracings are normal.     Physical Exam  Constitutional: She is oriented to person, place, and time. She appears well-developed and well-nourished. No distress.  HENT:  Head: Normocephalic.  Cardiovascular: Normal rate, regular rhythm and normal heart sounds.  Exam reveals no gallop and no friction rub.   No murmur heard. Respiratory: Effort normal and breath sounds normal. No respiratory distress. She has no wheezes. She has no rales.  GI: Soft. She exhibits no distension. There is no tenderness. There is no rebound and no guarding.  Genitourinary: Uterus normal. Vaginal discharge found.  Musculoskeletal: Normal range of motion. She exhibits no edema.  Neurological: She is alert and oriented to person, place, and time.  Skin: Skin is warm and dry.  Psychiatric: She has a normal mood and affect.    Prenatal labs: ABO, Rh: O/NEG/-- (12/16 1657) Antibody: NEG (05/07 1511) Rubella: 3.91 (12/16 1657) RPR: NON REAC (05/07 1511)  HBsAg: NEGATIVE (12/16 1657)  HIV: NONREACTIVE (05/07 1511)  GBS: Positive (07/18 0000)   Assessment/Plan: A:  SIUP at [redacted]w[redacted]d      PROM  at term      Early Labor  P:  Admit to [facility]      Routine orders       Offered Pitocin augmentation, wants to wait a while and use it needed later       Undecided re: epidural        Breastfeeding       Plans Vasectomy  [facility] 08/14/2013, 10:44 AM

## 2013-08-15 MED ORDER — LANOLIN HYDROUS EX OINT: TOPICAL_OINTMENT | CUTANEOUS | Status: DC | PRN

## 2013-08-15 MED ORDER — BENZOCAINE-MENTHOL 20-0.5 % EX AERO: 1.0000 "application " | INHALATION_SPRAY | CUTANEOUS | Status: DC | PRN

## 2013-08-15 MED ORDER — ONDANSETRON HCL 4 MG PO TABS: 4.0000 mg | ORAL_TABLET | ORAL | Status: DC | PRN

## 2013-08-15 MED ORDER — SIMETHICONE 80 MG PO CHEW
80.0000 mg | CHEWABLE_TABLET | ORAL | Status: DC | PRN
  Administered 2013-08-16: 80 mg via ORAL
  Filled 2013-08-15: qty 1

## 2013-08-15 MED ORDER — ONDANSETRON HCL 4 MG/2ML IJ SOLN
4.0000 mg | INTRAMUSCULAR | Status: DC | PRN
Start: 2013-08-15 — End: 2013-08-16

## 2013-08-15 MED ORDER — ZOLPIDEM TARTRATE 5 MG PO TABS: 5.0000 mg | ORAL_TABLET | Freq: Every evening | ORAL | Status: DC | PRN

## 2013-08-15 MED ORDER — DIBUCAINE 1 % RE OINT
1.0000 "application " | TOPICAL_OINTMENT | RECTAL | Status: DC | PRN
  Administered 2013-08-15: 1 via RECTAL
  Filled 2013-08-15: qty 28

## 2013-08-15 MED ORDER — TETANUS-DIPHTH-ACELL PERTUSSIS 5-2.5-18.5 LF-MCG/0.5 IM SUSP: 0.5000 mL | Freq: Once | INTRAMUSCULAR | Status: DC

## 2013-08-15 MED ORDER — IBUPROFEN 600 MG PO TABS
600.0000 mg | ORAL_TABLET | Freq: Four times a day (QID) | ORAL | Status: DC
  Administered 2013-08-15 – 2013-08-16 (×7): 600 mg via ORAL
  Filled 2013-08-15 (×7): qty 1

## 2013-08-15 MED ORDER — RHO D IMMUNE GLOBULIN 1500 UNIT/2ML IJ SOSY
300.0000 ug | PREFILLED_SYRINGE | Freq: Once | INTRAMUSCULAR | Status: AC
  Administered 2013-08-15: 300 ug via INTRAVENOUS
  Filled 2013-08-15: qty 2

## 2013-08-15 MED ORDER — DIPHENHYDRAMINE HCL 25 MG PO CAPS
25.0000 mg | ORAL_CAPSULE | Freq: Four times a day (QID) | ORAL | Status: DC | PRN
Start: 2013-08-15 — End: 2013-08-16

## 2013-08-15 MED ORDER — WITCH HAZEL-GLYCERIN EX PADS
1.0000 "application " | MEDICATED_PAD | CUTANEOUS | Status: DC | PRN
  Administered 2013-08-15: 1 via TOPICAL

## 2013-08-15 MED ORDER — OXYCODONE-ACETAMINOPHEN 5-325 MG PO TABS: 1.0000 | ORAL_TABLET | ORAL | Status: DC | PRN

## 2013-08-15 MED ORDER — SENNOSIDES-DOCUSATE SODIUM 8.6-50 MG PO TABS
2.0000 | ORAL_TABLET | ORAL | Status: DC
  Administered 2013-08-15: 2 via ORAL
  Filled 2013-08-15: qty 2

## 2013-08-15 MED ORDER — PRENATAL MULTIVITAMIN CH
1.0000 | ORAL_TABLET | Freq: Every day | ORAL | Status: DC
  Administered 2013-08-15 – 2013-08-16 (×2): 1 via ORAL
  Filled 2013-08-15 (×2): qty 1

## 2013-08-15 NOTE — Lactation Note (Signed)
This note was copied from the chart of Jasmine Gomez. Lactation Consultation Note Experienced BF mom of 6 months with 1st child and 9 months with 2nd child who is 5 yrs. Old. States latching well, has always had some trouble w/Rt. Nipple. Noted everted w/dimple in the middle. Rolls well in finger tips, nipple compressible, states just has to work harder for a deeper latch. Can express colostrum w/o difficulty. Encouraged to call for assistance if needed and to verify proper latch. Mom reports + breast changes w/pregnancy. Mom encouraged to feed baby w/feeding cues. Mom encouraged to feed baby 8-12 times/24 hours and with feeding cues. [place] brochure given w/resources, support groups and [place] services. Asked mom if she needed anything, and stated no.  Encouraged to call for assistance or questions. Patient Name: Jasmine Kaeden Depaz FHLKT'G Date: 08/15/2013     Maternal Data    Feeding Feeding Type: Breast Fed  LATCH Score/Interventions Latch: Grasps breast easily, tongue down, lips flanged, rhythmical sucking.  Audible Swallowing: A few with stimulation Intervention(s): Skin to skin;Hand expression  Type of Nipple: Everted at rest and after stimulation  Comfort (Breast/Nipple): Soft / non-tender     Hold (Positioning): No assistance needed to correctly position infant at breast. Intervention(s): Support Pillows  LATCH Score: 9  Lactation Tools Discussed/Used     Consult Status      Nicklaus Alviar G 08/15/2013, 4:19 AM

## 2013-08-15 NOTE — Progress Notes (Addendum)
38 year old G3P3003 admitted for PROM at [redacted]w[redacted]d gestation. Post Partum Day 1 Subjective: Patient feeling well. Sleeping well. No pain. Vaginal bleeding is minimal. She is ambulating, breastfeeding without trouble. Husband plans on vasectomy for contraception.  Objective: Blood pressure 109/62, pulse 60, temperature 97.8 F (36.6 C), temperature source Oral, resp. rate 20, height 5' 3.2" (1.605 m), weight 85.639 kg (188 lb 12.8 oz), last menstrual period 11/12/2012, SpO2 100.00%, unknown if currently breastfeeding.  Physical Exam:  General: alert, appears stated age and no distress Lochia: appropriate Uterine Fundus: firm Incision: none DVT Evaluation: No evidence of DVT seen on physical exam. Negative Homan's sign. No cords or calf tenderness.   Recent Labs  08/14/13 0715  HGB 12.9  HCT 36.0    Assessment/Plan: Post partum day 1. Recovering well. No complaints. Baby will need to stay 48 hours for GBS positive culture. Anticipate discharge early morning 7/23. Contraception will be vasectomy.   LOS: 1 day   Ancil Boozer 08/15/2013, 6:58 AM

## 2013-08-15 NOTE — Progress Notes (Signed)
Patient complaining of a "migraine headache". She states that she has had them before in the past. Vital signs stable. PIH assessment otherwise normal. Patient states that she takes Motrin at home for relief. Patient was given motrin per orders and Dr. Tamala Julian was notified of migraine. Will reassess pain in 1 hour.

## 2013-08-15 NOTE — Anesthesia Postprocedure Evaluation (Signed)
  Anesthesia Post-op Note  Patient: Jasmine Gomez  Procedure(s) Performed: * No procedures listed *  Patient Location: Mother/Baby  Anesthesia Type:Epidural  Level of Consciousness: awake  Airway and Oxygen Therapy: Patient Spontanous Breathing  Post-op Pain: none  Post-op Assessment: Patient's Cardiovascular Status Stable, Respiratory Function Stable, Patent Airway, No signs of Nausea or vomiting, Adequate PO intake, Pain level controlled, No headache, No backache, No residual numbness and No residual motor weakness  Post-op Vital Signs: Reviewed and stable  Last Vitals:  Filed Vitals:   08/15/13 1200  BP: 112/72  Pulse: 72  Temp:   Resp:     Complications: No apparent anesthesia complications

## 2013-08-15 NOTE — Progress Notes (Signed)
I examined pt and agree with documentation above and resident plan of care. Venia Carbon Michiel Cowboy, CNM

## 2013-08-16 LAB — RH IG WORKUP (INCLUDES ABO/RH)
ABO/RH(D): O NEG
Antibody Screen: POSITIVE
DAT, IGG: NEGATIVE
FETAL SCREEN: NEGATIVE
GESTATIONAL AGE(WKS): 39.2
UNIT DIVISION: 0

## 2013-08-16 MED ORDER — IBUPROFEN 600 MG PO TABS: 600.0000 mg | ORAL_TABLET | Freq: Four times a day (QID) | ORAL | Status: DC

## 2013-08-16 NOTE — Discharge Summary (Signed)
Obstetric Discharge Summary Reason for Admission: rupture of membranes Prenatal Procedures: none Intrapartum Procedures: spontaneous vaginal delivery Postpartum Procedures: none Complications-Operative and Postpartum: none Hemoglobin  Date Value Ref Range Status  08/14/2013 12.9  12.0 - 15.0 g/dL Final     HCT  Date Value Ref Range Status  08/14/2013 36.0  36.0 - 46.0 % Final   38 year old G3P3003 admitted for rupture of membranes at [redacted]w[redacted]d gestation. Patient was grossly ruptured on admission and was monitored throughout the day. Labor progressed without complication and at 8676 a viable Female was delivered via Spontaneous vaginal delivery  After several brief pushes, head was delivered OA over an intact perineum. There was a loose nuchal cord that was easily reduced. Shoulders and body were extracted from the vagina without difficulty. Baby was vigorous and handed to mom for skin to skin. After 2 minutes, cord was clamped and cut. Vaginal bleeding was minimal. With gentle traction, placenta was removed without difficulty. No vaginal lacerations were noted after examination of the vagina and cervix. Uterus was firm and was given brief fundal massage. Bleeding was controlled afterwards. Needle and sponge count were completed and were correct.   Patient had routine recovery without complication following delivery. Pain and bleeding were minimal. Husband will undergo vasectomy for contraception. Followup in 6 weeks with regular provider.    Physical Exam:  General: alert, cooperative and no distress Lochia: appropriate Uterine Fundus: firm Incision: n/a DVT Evaluation: No evidence of DVT seen on physical exam. Negative Homan's sign. No cords or calf tenderness.  Discharge Diagnoses: Term Pregnancy-delivered  Discharge Information: Date: 08/16/2013 Activity: pelvic rest Diet: routine Medications: PNV and Ibuprofen Condition: stable Instructions: Contact physician for any worsening pain,  vaginal bleeding, nausea, fever Discharge to: home Follow-up Information   Follow up with LEGGETT,KELLY H., MD In 6 weeks.   Specialty:  Obstetrics and Gynecology   Contact information:   [address] [place] [address] [phone]       Newborn Data: Live born female  Birth Weight: 8 lb 8.9 oz (3881 g) APGAR: 9, 9  Home with mother.  Toney Reil A 08/16/2013, 7:44 AM  I examined pt and agree with documentation above and resident plan of care. Venia Carbon Michiel Cowboy, CNM

## 2013-08-16 NOTE — Lactation Note (Signed)
This note was copied from the chart of Jasmine Sheilyn Boehlke. Lactation Consultation Note" Follow up visit with this experienced BF mom before DC. Reports that baby cluster fed through the night. Reports he is latching well. No questions at present. To call prn.  Patient Name: Jasmine Gomez Date: 08/16/2013 Reason for consult: Follow-up assessment   Maternal Data    Feeding   LATCH Score/Interventions                      Lactation Tools Discussed/Used     Consult Status Consult Status: Complete    Truddie Crumble 08/16/2013, 8:52 AM

## 2013-08-16 NOTE — Discharge Instructions (Signed)

## 2013-08-17 ENCOUNTER — Encounter: Payer: BC Managed Care – PPO | Admitting: Obstetrics & Gynecology

## 2013-08-18 ENCOUNTER — Telehealth: Payer: Self-pay | Admitting: *Deleted

## 2013-08-18 MED ORDER — BREAST PUMP MISC
Status: DC
Start: 2013-08-18 — End: 2014-05-17

## 2013-08-18 NOTE — Telephone Encounter (Signed)
RX for electric breast pump sent to [facility]

## 2013-09-14 ENCOUNTER — Encounter: Payer: Self-pay | Admitting: Obstetrics & Gynecology

## 2013-09-14 ENCOUNTER — Ambulatory Visit (INDEPENDENT_AMBULATORY_CARE_PROVIDER_SITE_OTHER): Payer: BC Managed Care – PPO | Admitting: Obstetrics & Gynecology

## 2013-09-14 VITALS — BP 122/81 | HR 76 | Temp 97.2°F | Resp 16

## 2013-09-14 DIAGNOSIS — O924 Hypogalactia: Secondary | ICD-10-CM

## 2013-09-14 DIAGNOSIS — O9270 Unspecified disorders of lactation: Secondary | ICD-10-CM

## 2013-09-14 MED ORDER — METOCLOPRAMIDE HCL 10 MG PO TABS: 10.0000 mg | ORAL_TABLET | Freq: Three times a day (TID) | ORAL | Status: DC

## 2013-09-14 NOTE — Progress Notes (Signed)
   Subjective:    Patient ID: Jasmine Gomez, female    DOB: 1975-07-06, 38 y.o.   MRN: [id number]  HPI  She is here at 4 weeks pp because her right breast is putting out only a very small amount of milk. This descripancy between the two boobs started after she was treated for right mastitis.   Review of Systems She breast fed her other children without this problem.    Objective:   Physical Exam  Her breast on the right feels like a non-lactating breast while the one on the left feels much fuller.      Assessment & Plan:  Decreased milk production from the right breast- She is already using phenogreek (?sp) and I will add reglan. I have recommended a lactation consult She reports that she drinks a large amount of water daily.

## 2013-09-19 ENCOUNTER — Other Ambulatory Visit: Payer: Self-pay | Admitting: *Deleted

## 2013-09-19 ENCOUNTER — Encounter: Payer: Self-pay | Admitting: Obstetrics & Gynecology

## 2013-09-19 ENCOUNTER — Ambulatory Visit (INDEPENDENT_AMBULATORY_CARE_PROVIDER_SITE_OTHER): Payer: BC Managed Care – PPO | Admitting: Obstetrics & Gynecology

## 2013-09-19 MED ORDER — METOCLOPRAMIDE HCL 10 MG PO TABS: 10.0000 mg | ORAL_TABLET | Freq: Three times a day (TID) | ORAL | Status: DC

## 2013-09-19 NOTE — Progress Notes (Signed)
  Subjective:     Jasmine Gomez is a 38 y.o. female who presents for a postpartum visit. She is 5 weeks postpartum following a spontaneous vaginal delivery. I have fully reviewed the prenatal and intrapartum course. The delivery was at 32 gestational weeks. Outcome: spontaneous vaginal delivery. Anesthesia: epidural. Postpartum course has been complicated by one episode of mastitis. Baby's course has been uneventful. Baby is feeding by breast and bottle. Bleeding pink. Bowel function is normal. Bladder function is normal. Patient is not sexually active. Contraception method is vasectomy. Postpartum depression screening: negative.  The following portions of the patient's history were reviewed and updated as appropriate: allergies, current medications, past family history, past medical history, past social history, past surgical history and problem list.  Review of Systems Pertinent items are noted in HPI.   Objective:    BP 107/71  Pulse 65  Resp 16  Ht 5\' 4"  (1.626 m)  Wt 168 lb (76.204 kg)  BMI 28.82 kg/m2  Breastfeeding? Yes  General:  alert, cooperative and no distress   Breasts:  inspection negative, no nipple discharge or bleeding, no masses or nodularity palpable  Lungs: clear to auscultation bilaterally  Heart:  regular rate and rhythm  Abdomen: soft, non-tender; bowel sounds normal; no masses,  no organomegaly   Vulva:  normal  Vagina: normal vagina  Cervix:  no lesions  Corpus: mild tenderness with deep exam (retroverted)  Adnexa:  normal adnexa  Rectal Exam: Not performed.        Assessment:     Nml postpartum exam. Pap smear not done at today's visit.   Plan:    1. Contraception: vasectomy 2. Pt having migraines but does not wish to have treatment.  Will contract [place] if she changes her mind 3. Follow up in: 1 year or as needed.  4.  Condoms until post vasectomy semen analysis.

## 2013-09-19 NOTE — Telephone Encounter (Signed)
RX for Reglan was sent to the wrong pharmacy it should have been sent to [facility].

## 2013-10-05 ENCOUNTER — Ambulatory Visit (INDEPENDENT_AMBULATORY_CARE_PROVIDER_SITE_OTHER): Payer: BC Managed Care – PPO | Admitting: Obstetrics & Gynecology

## 2013-10-05 ENCOUNTER — Encounter: Payer: Self-pay | Admitting: Obstetrics & Gynecology

## 2013-10-05 DIAGNOSIS — R1030 Lower abdominal pain, unspecified: Secondary | ICD-10-CM

## 2013-10-05 DIAGNOSIS — R109 Unspecified abdominal pain: Secondary | ICD-10-CM | POA: Diagnosis not present

## 2013-10-05 LAB — POCT URINALYSIS DIPSTICK
BILIRUBIN UA: NEGATIVE
Glucose, UA: NEGATIVE
KETONES UA: NEGATIVE
NITRITE UA: NEGATIVE
PH UA: 5
PROTEIN UA: NEGATIVE
Spec Grav, UA: 1.025
Urobilinogen, UA: 0.2

## 2013-10-05 MED ORDER — NITROFURANTOIN MONOHYD MACRO 100 MG PO CAPS: 100.0000 mg | ORAL_CAPSULE | Freq: Two times a day (BID) | ORAL | Status: DC

## 2013-10-05 NOTE — Progress Notes (Signed)
   Subjective:    Patient ID: Jasmine Gomez, female    DOB: 06/22/1975, 38 y.o.   MRN: [id number]  HPI  38 yo female has a several day history of suprapubic cramping.  No dysuria.  No N/V.  No vaginal bleeding.   Review of Systems     Objective:   Physical Exam   Abdomen is tender just over pubic symphysis. No rebound No guarding No mass     Assessment & Plan:  Probably UTI Rx with Macrobid.

## 2013-10-08 LAB — CULTURE, URINE COMPREHENSIVE: Colony Count: 35000

## 2013-10-09 ENCOUNTER — Telehealth: Payer: Self-pay | Admitting: *Deleted

## 2013-10-09 NOTE — Telephone Encounter (Signed)
Called pt to adv urine culture shows E-Coli - Finish abx and call back if needed - [facility]

## 2013-10-19 ENCOUNTER — Other Ambulatory Visit (INDEPENDENT_AMBULATORY_CARE_PROVIDER_SITE_OTHER): Payer: BC Managed Care – PPO | Admitting: *Deleted

## 2013-10-19 VITALS — Temp 97.1°F | Ht 63.5 in

## 2013-10-19 DIAGNOSIS — N390 Urinary tract infection, site not specified: Secondary | ICD-10-CM

## 2013-10-19 LAB — POCT URINALYSIS DIPSTICK
BILIRUBIN UA: NEGATIVE
Glucose, UA: NEGATIVE
KETONES UA: NEGATIVE
Nitrite, UA: NEGATIVE
Spec Grav, UA: 1.025
Urobilinogen, UA: 0.2
pH, UA: 5

## 2013-10-19 MED ORDER — SULFAMETHOXAZOLE-TMP DS 800-160 MG PO TABS: 1.0000 | ORAL_TABLET | Freq: Two times a day (BID) | ORAL | Status: DC

## 2013-10-19 NOTE — Progress Notes (Signed)
C/O's suprapubic pain but denies any urinary frequency but does have some bladder spasm.  UA showed blood and leukocytes in urine.  Culture sent and pt given Bactrim DS BID x 3 days.  Recommended AZO if needed.  Will call pt with culture results.

## 2013-10-21 LAB — CULTURE, URINE COMPREHENSIVE: Colony Count: 30000

## 2013-10-25 ENCOUNTER — Telehealth: Payer: Self-pay | Admitting: *Deleted

## 2013-10-25 DIAGNOSIS — N3010 Interstitial cystitis (chronic) without hematuria: Secondary | ICD-10-CM

## 2013-10-25 NOTE — Telephone Encounter (Signed)
Appt made with Dr Tennis Must @ [facility] in [facility]  11/27/13 @ 9:15.  This was the first appt available.  Pt given the phone number if she choses to change appt.

## 2013-10-25 NOTE — Telephone Encounter (Signed)
LM on voicemail that we would be sending her to a Urologist per Dr Gala Romney.  Advised pt to call me this afternoon if she had a preference to a urologist and I would make the referral for her.

## 2013-11-27 ENCOUNTER — Encounter: Payer: Self-pay | Admitting: Obstetrics & Gynecology

## 2014-03-15 ENCOUNTER — Encounter: Payer: Self-pay | Admitting: Obstetrics & Gynecology

## 2014-03-15 ENCOUNTER — Ambulatory Visit (INDEPENDENT_AMBULATORY_CARE_PROVIDER_SITE_OTHER): Payer: BC Managed Care – PPO | Admitting: Obstetrics & Gynecology

## 2014-03-15 VITALS — BP 108/75 | HR 67 | Resp 16 | Ht 64.0 in | Wt 164.0 lb

## 2014-03-15 DIAGNOSIS — N938 Other specified abnormal uterine and vaginal bleeding: Secondary | ICD-10-CM | POA: Diagnosis not present

## 2014-03-15 NOTE — Progress Notes (Signed)
   Subjective:    Patient ID: Jasmine Gomez, female    DOB: 1975/02/09, 39 y.o.   MRN: [id number]  HPI 39 yo MW P31 with a 25 month old son and 65 & 39 yo kids here today with the complaint that she has been bleeding for the last 18 days. She quit breast feeding about a month ago. She started having periods monthly when the baby was about 38 months old. Her husband had a vasectomy and had a clearance test once. He will do another soon. She denies any other issues.   Review of Systems     Objective:   Physical Exam  Normal cervix, small amount of bloody mucous noted. NSSA, NT, mobile, no adnexal masses       Assessment & Plan:  Irregular bleeding- check TSH, CBC I offered 10 days of provera but since her bleeding is now slowing up, she would prefer to not have hormones. If this problem continues, rec gyn u/s

## 2014-03-15 NOTE — Addendum Note (Signed)
Addended by: Gretchen Short on: 03/15/2014 03:49 PM   Modules accepted: Orders

## 2014-03-16 LAB — TSH: TSH: 2.224 u[IU]/mL (ref 0.350–4.500)

## 2014-03-16 LAB — CBC
HCT: 40.4 % (ref 36.0–46.0)
Hemoglobin: 13.5 g/dL (ref 12.0–15.0)
MCH: 31 pg (ref 26.0–34.0)
MCHC: 33.4 g/dL (ref 30.0–36.0)
MCV: 92.7 fL (ref 78.0–100.0)
MPV: 9.2 fL (ref 8.6–12.4)
Platelets: 380 10*3/uL (ref 150–400)
RBC: 4.36 MIL/uL (ref 3.87–5.11)
RDW: 12.9 % (ref 11.5–15.5)
WBC: 6.7 10*3/uL (ref 4.0–10.5)

## 2014-05-17 ENCOUNTER — Ambulatory Visit (INDEPENDENT_AMBULATORY_CARE_PROVIDER_SITE_OTHER): Payer: BC Managed Care – PPO | Admitting: Family Medicine

## 2014-05-17 ENCOUNTER — Encounter: Payer: Self-pay | Admitting: Family Medicine

## 2014-05-17 VITALS — BP 106/66 | HR 63 | Ht 64.0 in | Wt 165.0 lb

## 2014-05-17 DIAGNOSIS — Z0000 Encounter for general adult medical examination without abnormal findings: Secondary | ICD-10-CM

## 2014-05-17 DIAGNOSIS — L570 Actinic keratosis: Secondary | ICD-10-CM | POA: Diagnosis not present

## 2014-05-17 NOTE — Progress Notes (Signed)
Subjective:     Jasmine Gomez is a 39 y.o. female and is here for a comprehensive physical exam. The patient reports no problems.  History   Social History  . Marital Status: Married    Spouse Name: Legrand Como  . Number of Children: 2  . Years of Education: 17   Occupational History  . teacher     [place]   Social History Main Topics  . Smoking status: Former Smoker -- 10 years  . Smokeless tobacco: Not on file  . Alcohol Use: 1.2 oz/week    2 Glasses of wine per week  . Drug Use: No  . Sexual Activity: Yes    Birth Control/ Protection: Condom   Other Topics Concern  . Not on file   Social History Narrative   Health Maintenance  Topic Date Due  . INFLUENZA VACCINE  08/27/2014  . PAP SMEAR  11/16/2014  . TETANUS/TDAP  06/02/2023  . HIV Screening  Completed    The following portions of the patient's history were reviewed and updated as appropriate: allergies, current medications, past family history, past medical history, past social history, past surgical history and problem list.  Review of Systems A comprehensive review of systems was negative.   Objective:    BP 106/66 mmHg  Pulse 63  Ht 5\' 4"  (1.626 m)  Wt 165 lb (74.844 kg)  BMI 28.31 kg/m2 General appearance: alert, cooperative and appears stated age Head: Normocephalic, without obvious abnormality, atraumatic Eyes: conj clear, EOMI, PEERLA Ears: normal TM's and external ear canals both ears Nose: Nares normal. Septum midline. Mucosa normal. No drainage or sinus tenderness. Throat: lips, mucosa, and tongue normal; teeth and gums normal Neck: no adenopathy, no carotid bruit, no JVD, supple, symmetrical, trachea midline and thyroid not enlarged, symmetric, no tenderness/mass/nodules Back: symmetric, no curvature. ROM normal. No CVA tenderness. Lungs: clear to auscultation bilaterally Heart: regular rate and rhythm, S1, S2 normal, no murmur, click, rub or gallop Abdomen: soft, non-tender;  bowel sounds normal; no masses,  no organomegaly Extremities: extremities normal, atraumatic, no cyanosis or edema Pulses: 2+ and symmetric Skin: Skin color, texture, turgor normal. No rashes or lesions Lymph nodes: Cervical, supraclavicular, and axillary nodes normal. Neurologic: Alert and oriented X 3, normal strength and tone. Normal symmetric reflexes. Normal coordination and gait    Assessment:    Healthy female exam.      Plan:     See After Visit Summary for Counseling Recommendations   Keep up a regular exercise program and make sure you are eating a healthy diet Try to eat 4 servings of dairy a day, or if you are lactose intolerant take a calcium with vitamin D daily.  Your vaccines are up to date.   AK- right forehead.  Cryotherapy performed. Patient tolerated well.  Cryotherapy Procedure Note  Pre-operative Diagnosis: Actinic keratosis  Post-operative Diagnosis: same   Locations: right forehead  Indications: pre cancerous  Anesthesia: not required    Procedure Details  Patient informed of risks (permanent scarring, infection, light or dark discoloration, bleeding, infection, weakness, numbness and recurrence of the lesion) and benefits of the procedure and verbal informed consent obtained.  The areas are treated with liquid nitrogen therapy, frozen until ice ball extended 2 mm beyond lesion, allowed to thaw, and treated again. The patient tolerated procedure well.  The patient was instructed on post-op care, warned that there may be blister formation, redness and pain. Recommend OTC analgesia as needed for pain.  Condition: Stable  Complications: none.  Plan: 1. Instructed to keep the area dry and covered for 24-48h and clean thereafter. 2. Warning signs of infection were reviewed.   3. Recommended that the patient use OTC acetaminophen as needed for pain.  4. Return PRN.

## 2014-05-17 NOTE — Patient Instructions (Signed)
Keep up a regular exercise program and make sure you are eating a healthy diet Try to eat 4 servings of dairy a day, or if you are lactose intolerant take a calcium with vitamin D daily.  Your vaccines are up to date.   

## 2014-10-12 ENCOUNTER — Ambulatory Visit (INDEPENDENT_AMBULATORY_CARE_PROVIDER_SITE_OTHER): Payer: BC Managed Care – PPO | Admitting: Family Medicine

## 2014-10-12 ENCOUNTER — Encounter: Payer: Self-pay | Admitting: Family Medicine

## 2014-10-12 VITALS — BP 118/80 | HR 83 | Temp 99.3°F | Wt 159.0 lb

## 2014-10-12 DIAGNOSIS — R51 Headache: Secondary | ICD-10-CM | POA: Diagnosis not present

## 2014-10-12 DIAGNOSIS — J029 Acute pharyngitis, unspecified: Secondary | ICD-10-CM | POA: Diagnosis not present

## 2014-10-12 DIAGNOSIS — J069 Acute upper respiratory infection, unspecified: Secondary | ICD-10-CM | POA: Diagnosis not present

## 2014-10-12 DIAGNOSIS — R519 Headache, unspecified: Secondary | ICD-10-CM

## 2014-10-12 LAB — POCT RAPID STREP A (OFFICE): Rapid Strep A Screen: NEGATIVE

## 2014-10-12 NOTE — Patient Instructions (Signed)
Upper Respiratory Infection, Adult An upper respiratory infection (URI) is also sometimes known as the common cold. The upper respiratory tract includes the nose, sinuses, throat, trachea, and bronchi. Bronchi are the airways leading to the lungs. Most people improve within 1 week, but symptoms can last up to 2 weeks. A residual cough may last even longer.  CAUSES Many different viruses can infect the tissues lining the upper respiratory tract. The tissues become irritated and inflamed and often become very moist. Mucus production is also common. A cold is contagious. You can easily spread the virus to others by oral contact. This includes kissing, sharing a glass, coughing, or sneezing. Touching your mouth or nose and then touching a surface, which is then touched by another person, can also spread the virus. SYMPTOMS  Symptoms typically develop 1 to 3 days after you come in contact with a cold virus. Symptoms vary from person to person. They may include:  Runny nose.  Sneezing.  Nasal congestion.  Sinus irritation.  Sore throat.  Loss of voice (laryngitis).  Cough.  Fatigue.  Muscle aches.  Loss of appetite.  Headache.  Low-grade fever. DIAGNOSIS  You might diagnose your own cold based on familiar symptoms, since most people get a cold 2 to 3 times a year. Your caregiver can confirm this based on your exam. Most importantly, your caregiver can check that your symptoms are not due to another disease such as strep throat, sinusitis, pneumonia, asthma, or epiglottitis. Blood tests, throat tests, and X-rays are not necessary to diagnose a common cold, but they may sometimes be helpful in excluding other more serious diseases. Your caregiver will decide if any further tests are required. RISKS AND COMPLICATIONS  You may be at risk for a more severe case of the common cold if you smoke cigarettes, have chronic heart disease (such as heart failure) or lung disease (such as asthma), or if  you have a weakened immune system. The very young and very old are also at risk for more serious infections. Bacterial sinusitis, middle ear infections, and bacterial pneumonia can complicate the common cold. The common cold can worsen asthma and chronic obstructive pulmonary disease (COPD). Sometimes, these complications can require emergency medical care and may be life-threatening. PREVENTION  The best way to protect against getting a cold is to practice good hygiene. Avoid oral or hand contact with people with cold symptoms. Wash your hands often if contact occurs. There is no clear evidence that vitamin C, vitamin E, echinacea, or exercise reduces the chance of developing a cold. However, it is always recommended to get plenty of rest and practice good nutrition. TREATMENT  Treatment is directed at relieving symptoms. There is no cure. Antibiotics are not effective, because the infection is caused by a virus, not by bacteria. Treatment may include:  Increased fluid intake. Sports drinks offer valuable electrolytes, sugars, and fluids.  Breathing heated mist or steam (vaporizer or shower).  Eating chicken soup or other clear broths, and maintaining good nutrition.  Getting plenty of rest.  Using gargles or lozenges for comfort.  Controlling fevers with ibuprofen or acetaminophen as directed by your caregiver.  Increasing usage of your inhaler if you have asthma. Zinc gel and zinc lozenges, taken in the first 24 hours of the common cold, can shorten the duration and lessen the severity of symptoms. Pain medicines may help with fever, muscle aches, and throat pain. A variety of non-prescription medicines are available to treat congestion and runny nose. Your caregiver   can make recommendations and may suggest nasal or lung inhalers for other symptoms.  HOME CARE INSTRUCTIONS   Only take over-the-counter or prescription medicines for pain, discomfort, or fever as directed by your  caregiver.  Use a warm mist humidifier or inhale steam from a shower to increase air moisture. This may keep secretions moist and make it easier to breathe.  Drink enough water and fluids to keep your urine clear or pale yellow.  Rest as needed.  Return to work when your temperature has returned to normal or as your caregiver advises. You may need to stay home longer to avoid infecting others. You can also use a face mask and careful hand washing to prevent spread of the virus. SEEK MEDICAL CARE IF:   After the first few days, you feel you are getting worse rather than better.  You need your caregiver's advice about medicines to control symptoms.  You develop chills, worsening shortness of breath, or brown or red sputum. These may be signs of pneumonia.  You develop yellow or brown nasal discharge or pain in the face, especially when you bend forward. These may be signs of sinusitis.  You develop a fever, swollen neck glands, pain with swallowing, or white areas in the back of your throat. These may be signs of strep throat. SEEK IMMEDIATE MEDICAL CARE IF:   You have a fever.  You develop severe or persistent headache, ear pain, sinus pain, or chest pain.  You develop wheezing, a prolonged cough, cough up blood, or have a change in your usual mucus (if you have chronic lung disease).  You develop sore muscles or a stiff neck. Document Released: 07/08/2000 Document Revised: 04/06/2011 Document Reviewed: 04/19/2013 Exit[employer] Patient Information 2015 [employer]. This information is not intended to replace advice given to you by your health care provider. Make sure you discuss any questions you have with your health care provider.  

## 2014-10-12 NOTE — Progress Notes (Signed)
   Subjective:    Patient ID: Jasmine Gomez, female    DOB: [birth year], 39 y.o.   MRN: [id number]  HPI   39 yo female comes in today not feeling well.  Started with diarrhea 2 days ago and that is better but now has sinus congestion and temp to 99. She has some yellow nasal drainage. Today HA and is severe in the back of the head. And radiating towards temples bilaterally. Not on forehead. No facial pain.   No ST.  Pos exposure to strep.  No cough. Mild  ST today.  No ear pain.    Review of Systems     Objective:   Physical Exam  Constitutional: She is oriented to person, place, and time. She appears well-developed and well-nourished.  HENT:  Head: Normocephalic and atraumatic.  Right Ear: External ear normal.  Left Ear: External ear normal.  Nose: Nose normal.  Mouth/Throat: Oropharynx is clear and moist.  TMs and canals are clear.   Eyes: Conjunctivae and EOM are normal. Pupils are equal, round, and reactive to light.  Neck: Neck supple. No thyromegaly present.  Cardiovascular: Normal rate, regular rhythm and normal heart sounds.   Pulmonary/Chest: Effort normal and breath sounds normal. She has no wheezes.  Lymphadenopathy:    She has no cervical adenopathy.  Neurological: She is alert and oriented to person, place, and time.  Skin: Skin is warm and dry.  Psychiatric: She has a normal mood and affect.        Assessment & Plan:  URI with HA.  She reports the headache as severe so we'll give a Toradol injection here in the office today for acute relief. Recommend she go home even a little bit and try to go ahead and go to bed early and get some rest. Okay to use Aleve and ibuprofen. If she feels like she's not getting better, develops a fever, suddenly gets worse, or is just not better in a week then consider treating for bacterial sinus infection.

## 2014-11-20 ENCOUNTER — Other Ambulatory Visit: Payer: Self-pay | Admitting: Family Medicine

## 2014-11-20 LAB — COMPLETE METABOLIC PANEL WITH GFR
ALT: 13 U/L (ref 6–29)
AST: 13 U/L (ref 10–30)
Albumin: 4.3 g/dL (ref 3.6–5.1)
Alkaline Phosphatase: 43 U/L (ref 33–115)
BILIRUBIN TOTAL: 0.6 mg/dL (ref 0.2–1.2)
BUN: 13 mg/dL (ref 7–25)
CO2: 26 mmol/L (ref 20–31)
Calcium: 9.4 mg/dL (ref 8.6–10.2)
Chloride: 104 mmol/L (ref 98–110)
Creat: 0.72 mg/dL (ref 0.50–1.10)
GLUCOSE: 94 mg/dL (ref 65–99)
Potassium: 4.3 mmol/L (ref 3.5–5.3)
Sodium: 139 mmol/L (ref 135–146)
TOTAL PROTEIN: 6.4 g/dL (ref 6.1–8.1)

## 2014-11-20 LAB — LIPID PANEL
Cholesterol: 233 mg/dL — ABNORMAL HIGH (ref 125–200)
HDL: 41 mg/dL — AB (ref >=46)
LDL Cholesterol: 158 mg/dL — ABNORMAL HIGH (ref <130)
TRIGLYCERIDES: 171 mg/dL — AB (ref <150)
Total CHOL/HDL Ratio: 5.7 Ratio — ABNORMAL HIGH (ref <=5.0)
VLDL: 34 mg/dL — ABNORMAL HIGH (ref <30)

## 2015-03-11 ENCOUNTER — Encounter: Payer: Self-pay | Admitting: Family Medicine

## 2015-03-11 ENCOUNTER — Ambulatory Visit: Payer: BC Managed Care – PPO | Admitting: Family Medicine

## 2015-03-14 ENCOUNTER — Encounter: Payer: Self-pay | Admitting: Family Medicine

## 2015-03-14 ENCOUNTER — Ambulatory Visit (INDEPENDENT_AMBULATORY_CARE_PROVIDER_SITE_OTHER): Payer: BC Managed Care – PPO | Admitting: Family Medicine

## 2015-03-14 VITALS — BP 115/75 | HR 69 | Wt 160.3 lb

## 2015-03-14 DIAGNOSIS — L723 Sebaceous cyst: Secondary | ICD-10-CM | POA: Diagnosis not present

## 2015-03-14 DIAGNOSIS — L089 Local infection of the skin and subcutaneous tissue, unspecified: Secondary | ICD-10-CM

## 2015-03-14 NOTE — Patient Instructions (Signed)
Pulls string about 1/2 an inch daily and can trim but leave a tail for the next day.   Keep covered in the shower with water proof bandaid until healing over.   Can change gauze in AM if needed.  But try to keep on for 24 hours if can.

## 2015-03-15 NOTE — Progress Notes (Signed)
   Subjective:    Patient ID: Jasmine Gomez, female    DOB: 12-03-1975, 40 y.o.   MRN: [id number]  HPI Her today for possible infected sebaceous cyst. She has had a sebaceous cyst on her upper back for almost a year. Unfortunately recently it got bumped and became more irritated and inflamed. Has not actively draining but has continued to be tender and has felt hot to touch. No fevers chills or sweats.   Review of Systems     Objective:   Physical Exam        Assessment & Plan:  Infected sebaceous cyst-I&D performed. Patient tolerated well. Since there is no streaking of erythema around the lesion will hold off on antibiotic treatment. If she starts to notice any streaking increased tenderness pain or fevers or chills please call us back immediately and will place her on an antibiotic.  Incision and Drainage Procedure Note  Pre-operative Diagnosis: abscess/seb cyst  Post-operative Diagnosis: same  Indications: pain, infection  Anesthesia: 1% lidocaine with epinephrine  Procedure Details  The procedure, risks and complications have been discussed in detail (including, but not limited to airway compromise, infection, bleeding) with the patient, and the patient has signed consent to the procedure.  The skin was sterilely prepped and draped over the affected area in the usual fashion. After adequate local anesthesia, I&D with a #11 blade was performed on the upper right back . Purulent drainage: present.  Forceps were used to remove as much of the capsule wall as possible. A sterile applicator was used to gently probe the wound for any tracts or pockets.  The patient was observed until stable.  Findings: Infected seb cyst  EBL: 0.5 cc's  Drains: packing placed.    Condition: Tolerated procedure well   Complications: none.

## 2015-03-18 ENCOUNTER — Ambulatory Visit: Payer: BC Managed Care – PPO | Admitting: Family Medicine

## 2015-03-31 ENCOUNTER — Emergency Department
Admission: EM | Admit: 2015-03-31 | Discharge: 2015-03-31 | Disposition: A | Discharge status: Home / Self Care | Payer: BC Managed Care – PPO | Source: Home / Self Care | Attending: Family Medicine | Admitting: Family Medicine

## 2015-03-31 ENCOUNTER — Encounter: Payer: Self-pay | Admitting: Emergency Medicine

## 2015-03-31 DIAGNOSIS — J111 Influenza due to unidentified influenza virus with other respiratory manifestations: Secondary | ICD-10-CM

## 2015-03-31 DIAGNOSIS — R69 Illness, unspecified: Principal | ICD-10-CM

## 2015-03-31 LAB — POCT INFLUENZA A/B
INFLUENZA B, POC: NEGATIVE
Influenza A, POC: NEGATIVE

## 2015-03-31 MED ORDER — OSELTAMIVIR PHOSPHATE 75 MG PO CAPS: 75.0000 mg | ORAL_CAPSULE | Freq: Two times a day (BID) | ORAL | Status: DC

## 2015-03-31 NOTE — ED Notes (Signed)
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

## 2015-03-31 NOTE — ED Provider Notes (Signed)
CSN: [id number]     Arrival date & time 03/31/15  1553 History   First MD Initiated Contact with Patient 03/31/15 1741     Chief Complaint  Patient presents with  . Nasal Congestion  . Cough  . Generalized Body Aches      HPI Comments: Complains of 2 day history flu-like illness including myalgias, headache, fever/chills, fatigue, and cough.  Also has mild nasal congestion and sore throat.  Cough is non-productive and somewhat worse at night.  No pleuritic pain or shortness of breath.  The history is provided by the patient.    Past Medical History  Diagnosis Date  . Family history of breast cancer 11/16/2011    Mother and sister with  BrCa.  Patient is neg for BrCa gene.     Marland Kitchen Headache(784.0)   . Migraines    Past Surgical History  Procedure Laterality Date  . Nasal sinus surgery  1989  . Tonsillectomy  1988  . Wisdom tooth extraction     Family History  Problem Relation Age of Onset  . Breast cancer Mother   . Hyperlipidemia Father   . Breast cancer Sister    Social History  Substance Use Topics  . Smoking status: Former Smoker -- 10 years    Quit date: 01/26/2009  . Smokeless tobacco: None  . Alcohol Use: 1.2 oz/week    2 Glasses of wine per week   OB History    Gravida Para Term Preterm AB TAB SAB Ectopic Multiple Living   _0 Review of Systems + sore throat + hoarse + cough No pleuritic pain No wheezing + nasal congestion ? post-nasal drainage No sinus pain/pressure No itchy/red eyes No earache No hemoptysis No SOB + fever, + chills No nausea No vomiting No abdominal pain No diarrhea No urinary symptoms No skin rash + fatigue + myalgias + headache Used OTC meds without relief  Allergies  Review of patient's allergies indicates no known allergies.  Home Medications   Prior to Admission medications   Medication Sig Start Date End Date Taking? Authorizing Provider  acetaminophen (TYLENOL) 500 MG tablet Take 1,000 mg by mouth  every 6 (six) hours as needed for headache.    Historical Provider, MD  loratadine (CLARITIN) 10 MG tablet Take 10 mg by mouth daily.    Historical Provider, MD  oseltamivir (TAMIFLU) 75 MG capsule Take 1 capsule (75 mg total) by mouth every 12 (twelve) hours. 03/31/15   Kandra Nicolas, MD   Meds Ordered and Administered this Visit  Medications - No data to display  BP 112/74 mmHg  Pulse 89  Temp(Src) 99.1 F (37.3 C) (Oral)  Resp 16  Ht _1  (1.626 m)  Wt 160 lb (72.576 kg)  BMI 27.45 kg/m2  SpO2 100%  LMP 03/05/2015 (Approximate) No data found.   Physical Exam Nursing notes and Vital Signs reviewed. Appearance:  Patient appears stated age, and in no acute distress Eyes:  Pupils are equal, round, and reactive to light and accomodation.  Extraocular movement is intact.  Conjunctivae are not inflamed  Ears:  Canals normal.  Tympanic membranes normal.  Nose:  Mildly congested turbinates.  No sinus tenderness.   Pharynx:  Normal Neck:  Supple.  Tender enlarged posterior nodes are palpated bilaterally  Lungs:  Clear to auscultation.  Breath sounds are equal.  Moving air well. Heart:  Regular rate and rhythm without murmurs, rubs, or  gallops.  Abdomen:  Nontender without masses or hepatosplenomegaly.  Bowel sounds are present.  No CVA or flank tenderness.  Extremities:  No edema.  Skin:  No rash present.   ED Course  Procedures none    Labs Reviewed  POCT INFLUENZA A/B negative     MDM   1. Influenza-like illness    Suspect influenza despite negative flu test. Begin Tamiflu. Take plain guaifenesin (1274m extended release tabs such as Mucinex) twice daily, with plenty of water, for cough and congestion.  May add Pseudoephedrine (32m one or two every 4 to 6 hours) for sinus congestion.  Get adequate rest.   Try warm salt water gargles for sore throat.  Stop all antihistamines for now, and other non-prescription cough/cold preparations. May take Delsym Cough Suppressant at  bedtime for nighttime cough.  May take Ibuprofen 20010m4 tabs every 8 hours with food for body aches, headache, etc. Followup with Family Doctor if not improved in about 5 days.    SteKandra NicolasD 04/02/15 1001

## 2015-03-31 NOTE — Discharge Instructions (Signed)
Take plain guaifenesin (1200mg  extended release tabs such as Mucinex) twice daily, with plenty of water, for cough and congestion.  May add Pseudoephedrine (30mg , one or two every 4 to 6 hours) for sinus congestion.  Get adequate rest.   Try warm salt water gargles for sore throat.  Stop all antihistamines for now, and other non-prescription cough/cold preparations. May take Delsym Cough Suppressant at bedtime for nighttime cough.  May take Ibuprofen 200mg , 4 tabs every 8 hours with food for body aches, headache, etc.   Influenza, Adult Influenza ("the flu") is a viral infection of the respiratory tract. It occurs more often in winter months because people spend more time in close contact with one another. Influenza can make you feel very sick. Influenza easily spreads from person to person (contagious). CAUSES  Influenza is caused by a virus that infects the respiratory tract. You can catch the virus by breathing in droplets from an infected person's cough or sneeze. You can also catch the virus by touching something that was recently contaminated with the virus and then touching your mouth, nose, or eyes. RISKS AND COMPLICATIONS You may be at risk for a more severe case of influenza if you smoke cigarettes, have diabetes, have chronic heart disease (such as heart failure) or lung disease (such as asthma), or if you have a weakened immune system. Elderly people and pregnant women are also at risk for more serious infections. The most common problem of influenza is a lung infection (pneumonia). Sometimes, this problem can require emergency medical care and may be life threatening. SIGNS AND SYMPTOMS  Symptoms typically last 4 to 10 days and may include:  Fever.  Chills.  Headache, body aches, and muscle aches.  Sore throat.  Chest discomfort and cough.  Poor appetite.  Weakness or feeling tired.  Dizziness.  Nausea or vomiting. DIAGNOSIS  Diagnosis of influenza is often made based  on your history and a physical exam. A nose or throat swab test can be done to confirm the diagnosis. TREATMENT  In mild cases, influenza goes away on its own. Treatment is directed at relieving symptoms. For more severe cases, your health care provider may prescribe antiviral medicines to shorten the sickness. Antibiotic medicines are not effective because the infection is caused by a virus, not by bacteria. HOME CARE INSTRUCTIONS  Take medicines only as directed by your health care provider.  Use a cool mist humidifier to make breathing easier.  Get plenty of rest until your temperature returns to normal. This usually takes 3 to 4 days.  Drink enough fluid to keep your urine clear or pale yellow.  Cover yourmouth and nosewhen coughing or sneezing,and wash your handswellto prevent thevirusfrom spreading.  Stay homefromwork orschool untilthe fever is gonefor at least 46full day. PREVENTION  An annual influenza vaccination (flu shot) is the best way to avoid getting influenza. An annual flu shot is now routinely recommended for all adults in the [place] IF:  You experiencechest pain, yourcough worsens,or you producemore mucus.  Youhave nausea,vomiting, ordiarrhea.  Your fever returns or gets worse. SEEK IMMEDIATE MEDICAL CARE IF:  You havetrouble breathing, you become short of breath,or your skin ornails becomebluish.  You have severe painor stiffnessin the neck.  You develop a sudden headache, or pain in the face or ear.  You have nausea or vomiting that you cannot control. MAKE SURE YOU:   Understand these instructions.  Will watch your condition.  Will get help right away if you are  not doing well or get worse.   This information is not intended to replace advice given to you by your health care provider. Make sure you discuss any questions you have with your health care provider.   Document Released: 01/10/2000 Document Revised:  02/02/2014 Document Reviewed: 04/13/2011 Elsevier Interactive Patient Education [employer].

## 2015-08-13 ENCOUNTER — Encounter: Payer: Self-pay | Admitting: Family Medicine

## 2015-08-13 ENCOUNTER — Ambulatory Visit (INDEPENDENT_AMBULATORY_CARE_PROVIDER_SITE_OTHER): Payer: BC Managed Care – PPO | Admitting: Family Medicine

## 2015-08-13 VITALS — BP 109/61 | HR 73 | Wt 162.0 lb

## 2015-08-13 DIAGNOSIS — R430 Anosmia: Secondary | ICD-10-CM | POA: Diagnosis not present

## 2015-08-13 DIAGNOSIS — Z0000 Encounter for general adult medical examination without abnormal findings: Secondary | ICD-10-CM | POA: Diagnosis not present

## 2015-08-13 NOTE — Addendum Note (Signed)
Addended by: Beatrice Lecher D on: 08/13/2015 11:10 AM   Modules accepted: Miquel Dunn

## 2015-08-13 NOTE — Progress Notes (Signed)
Subjective:     Jasmine Gomez is a 40 y.o. female and is here for a comprehensive physical exam. The patient reports no problems.She did mention to me that she's planning on changing her diet. She's running on eating more plant based and decreasing her meat and eating more low carb. She does currently walk 2 miles a day on 4-5 days per week. She has an appointment later this summer with her GYN and will schedule her mammogram then.  She did mention that she's had a loss of smell for almost 3 years. She says it's not complete but she has had something right under her nose to actually smell it. She did have nasal polyps removed when she was in seventh grade and does have some chronic congestion.  Migraine headache with aura-she says they're infrequent. But she does get nauseated with them. Sleep seems to relieve the headaches as well as Excedrin Migraine which helps sometimes. She says she's tried multiple tryptans in the past and felt they really just made her feel fuzzy headed and is not interested in trying a new one at this time. No head trauma.  Not using any allergy medicaions or nasal sprays.   Social History   Social History  . Marital Status: Married    Spouse Name: Legrand Como  . Number of Children: 2  . Years of Education: 17   Occupational History  . teacher     [employer]   Social History Main Topics  . Smoking status: Former Smoker -- 10 years    Quit date: 01/26/2009  . Smokeless tobacco: Not on file  . Alcohol Use: 1.2 oz/week    2 Glasses of wine per week  . Drug Use: No  . Sexual Activity:    Partners: Male    Birth Control/ Protection: Condom   Other Topics Concern  . Not on file   Social History Narrative   No regular exercise.     Health Maintenance  Topic Date Due  . PAP SMEAR  11/16/2014  . INFLUENZA VACCINE  08/27/2015  . TETANUS/TDAP  06/02/2023  . HIV Screening  Completed    The following portions of the patient's history were reviewed  and updated as appropriate: allergies, current medications, past family history, past medical history, past social history, past surgical history and problem list.  Review of Systems A comprehensive review of systems was negative.   Objective:    BP 109/61 mmHg  Pulse 73  Wt 162 lb (73.483 kg)  SpO2 98%  Breastfeeding? No General appearance: alert, cooperative and appears stated age Head: Normocephalic, without obvious abnormality, atraumatic Eyes: conj claer, EOMI, PEERLA Ears: normal TM's and external ear canals both ears Nose: Nares normal. Septum midline. Mucosa normal. No drainage or sinus tenderness. Throat: lips, mucosa, and tongue normal; teeth and gums normal Neck: no adenopathy, no carotid bruit, no JVD, supple, symmetrical, trachea midline and thyroid not enlarged, symmetric, no tenderness/mass/nodules Back: symmetric, no curvature. ROM normal. No CVA tenderness. Lungs: clear to auscultation bilaterally Heart: regular rate and rhythm, S1, S2 normal, no murmur, click, rub or gallop Abdomen: soft, non-tender; bowel sounds normal; no masses,  no organomegaly Extremities: extremities normal, atraumatic, no cyanosis or edema Pulses: 2+ and symmetric Skin: Skin color, texture, turgor normal. No rashes or lesions Lymph nodes: Cervical adenopathy: nl and Axillary adenopathy: not examined Neurologic: Alert and oriented X 3, normal strength and tone. Normal symmetric reflexes. Normal coordination and gait    Assessment:  Healthy female exam.      Plan:     See After Visit Summary for Counseling Recommendations  Keep up a regular exercise program and make sure you are eating a healthy diet Try to eat 4 servings of dairy a day, or if you are lactose intolerant take a calcium with vitamin D daily.  Your vaccines are up to date.   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  Could be sinus anatomy, nasal polyps versus brain issues.

## 2015-08-27 ENCOUNTER — Encounter: Payer: Self-pay | Admitting: Obstetrics & Gynecology

## 2015-08-27 ENCOUNTER — Ambulatory Visit (INDEPENDENT_AMBULATORY_CARE_PROVIDER_SITE_OTHER): Payer: BC Managed Care – PPO | Admitting: Obstetrics & Gynecology

## 2015-08-27 VITALS — BP 115/76 | Resp 16 | Ht 66.0 in | Wt 160.0 lb

## 2015-08-27 DIAGNOSIS — Z124 Encounter for screening for malignant neoplasm of cervix: Secondary | ICD-10-CM | POA: Diagnosis not present

## 2015-08-27 DIAGNOSIS — Z1151 Encounter for screening for human papillomavirus (HPV): Secondary | ICD-10-CM | POA: Diagnosis not present

## 2015-08-27 DIAGNOSIS — Z0000 Encounter for general adult medical examination without abnormal findings: Secondary | ICD-10-CM

## 2015-08-27 DIAGNOSIS — Z01419 Encounter for gynecological examination (general) (routine) without abnormal findings: Secondary | ICD-10-CM | POA: Diagnosis not present

## 2015-08-27 NOTE — Progress Notes (Signed)
Subjective:     Jasmine Gomez is a 40 y.o. female here for a routine exam.  Current complaints: None.  Has 7 day menses almost every month.  She has skipped a couple of menses a few times over the past 2 years but mostly regular.  She did have a couple of 10 day menses.   Gynecologic History Patient's last menstrual period was 08/20/2015. Contraception: vasectomy Last Pap: al nml per patient; due today. Last mammogram: 2013 Results were: normal  Obstetric History OB History  Gravida Para Term Preterm AB Living  '3 3 3     3  ' SAB TAB Ectopic Multiple Live Births          3    # Outcome Date GA Lbr Len/2nd Weight Sex Delivery Anes PTL Lv  3 Term 08/14/13 2w2d18:26 / 00:22 8 lb 8.9 oz (3.881 kg) M Vag-Spont EPI  LIV  2 Term 08/26/07 380w0d8 lb 6 oz (3.799 kg) M Vag-Spont EPI N LIV  1 Term 09/18/05 4064w0d lb 2 oz (3.685 kg) M Vag-Spont EPI N LIV       The following portions of the patient's history were reviewed and updated as appropriate: allergies, current medications, past family history, past medical history, past social history, past surgical history and problem list.  Review of Systems Pertinent items noted in HPI and remainder of comprehensive ROS otherwise negative.    Objective:      Vitals:   08/27/15 1528  BP: 115/76  Resp: 16  Weight: 160 lb (72.6 kg)  Height: '5\' 6"'  (1.676 m)   Vitals:  WNL General appearance: alert, cooperative and no distress  HEENT: Normocephalic, without obvious abnormality, atraumatic Eyes: negative Throat: lips, mucosa, and tongue normal; teeth and gums normal  Respiratory: Clear to auscultation bilaterally  CV: Regular rate and rhythm  Breasts:  Normal appearance, no masses or tenderness, no nipple retraction or dimpling  GI: Soft, non-tender; bowel sounds normal; no masses,  no organomegaly  GU: External Genitalia:  Tanner V, no lesion Urethra:  No prolapse   Vagina: Pink, normal rugae, no blood or discharge  Cervix: No  CMT, no lesion  Uterus:  Normal size and contour, non tender  Adnexa: Normal, no masses, non tender  Musculoskeletal: No edema, redness or tenderness in the calves or thighs  Skin: No lesions or rash  Lymphatic: Axillary adenopathy: none    Psychiatric: Normal mood and behavior        Assessment:    Healthy female exam.    Plan:    Education reviewed: skin cancer screening. Contraception: vasectomy. Mammogram ordered. Follow up in: 1 year.     Will ask radiology screening recommendations for patient.  She is BRCA negative, but bother her sister and mother had breast cancer and are BRCA positive.

## 2015-08-29 LAB — CERVICOVAGINAL ANCILLARY ONLY: HPV: NOT DETECTED

## 2015-08-29 LAB — CYTOLOGY - PAP

## 2016-03-03 ENCOUNTER — Ambulatory Visit (INDEPENDENT_AMBULATORY_CARE_PROVIDER_SITE_OTHER): Payer: BC Managed Care – PPO | Admitting: Sports Medicine

## 2016-03-03 DIAGNOSIS — J111 Influenza due to unidentified influenza virus with other respiratory manifestations: Secondary | ICD-10-CM | POA: Insufficient documentation

## 2016-03-03 DIAGNOSIS — R69 Illness, unspecified: Secondary | ICD-10-CM | POA: Diagnosis not present

## 2016-03-03 MED ORDER — THERAFLU SEVERE COLD/CGH DAY 20-10-650 MG PO PACK: PACK | ORAL | 0 refills | Status: DC

## 2016-03-03 MED ORDER — OSELTAMIVIR PHOSPHATE 75 MG PO CAPS: 75.0000 mg | ORAL_CAPSULE | Freq: Two times a day (BID) | ORAL | 0 refills | Status: DC

## 2016-03-03 NOTE — Assessment & Plan Note (Signed)
Tamiflu, TheraFlu, out of work until Monday.

## 2016-03-03 NOTE — Progress Notes (Signed)
  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 Moderate, persistent.  Past medical history:  Negative.  See flowsheet/record as well for more information.  Surgical history: Negative.  See flowsheet/record as well for more information.  Family history: Negative.  See flowsheet/record as well for more information.  Social history: Negative.  See flowsheet/record as well for more information.  Allergies, and medications have been entered into the medical record, reviewed, and no changes needed.   Review of Systems: No fevers, chills, night sweats, weight loss, chest pain, or shortness of breath.   Objective:    General: Well Developed, well nourished, and in no acute distress.  Neuro: Alert and oriented x3, extra-ocular muscles intact, sensation grossly intact.  HEENT: Normocephalic, atraumatic, pupils equal round reactive to light, neck supple, no masses, no lymphadenopathy, thyroid nonpalpable. Oropharynx, nasopharynx, ear canals are markable. Skin: Warm and dry, no rashes. Cardiac: Regular rate and rhythm, no murmurs rubs or gallops, no lower extremity edema.  Respiratory: Clear to auscultation bilaterally. Not using accessory muscles, speaking in full sentences.  Impression and Recommendations:    Influenza-like illness Tamiflu, TheraFlu, out of work until Monday.

## 2016-07-09 ENCOUNTER — Ambulatory Visit: Payer: BC Managed Care – PPO | Admitting: Family Medicine

## 2016-08-18 ENCOUNTER — Encounter: Payer: Self-pay | Admitting: Family Medicine

## 2016-08-18 ENCOUNTER — Ambulatory Visit (INDEPENDENT_AMBULATORY_CARE_PROVIDER_SITE_OTHER): Payer: BC Managed Care – PPO | Admitting: Family Medicine

## 2016-08-18 VITALS — BP 122/71 | HR 68 | Wt 165.0 lb

## 2016-08-18 DIAGNOSIS — Z0000 Encounter for general adult medical examination without abnormal findings: Secondary | ICD-10-CM | POA: Diagnosis not present

## 2016-08-18 DIAGNOSIS — Z1231 Encounter for screening mammogram for malignant neoplasm of breast: Secondary | ICD-10-CM

## 2016-08-18 NOTE — Patient Instructions (Addendum)
Look up [employer]'s Choice for Skin care.    Health Maintenance, Female Adopting a healthy lifestyle and getting preventive care can go a long way to promote health and wellness. Talk with your health care provider about what schedule of regular examinations is right for you. This is a good chance for you to check in with your provider about disease prevention and staying healthy. In between checkups, there are plenty of things you can do on your own. Experts have done a lot of research about which lifestyle changes and preventive measures are most likely to keep you healthy. Ask your health care provider for more information. Weight and diet Eat a healthy diet  Be sure to include plenty of vegetables, fruits, low-fat dairy products, and lean protein.  Do not eat a lot of foods high in solid fats, added sugars, or salt.  Get regular exercise. This is one of the most important things you can do for your health. ? Most adults should exercise for at least 150 minutes each week. The exercise should increase your heart rate and make you sweat (moderate-intensity exercise). ? Most adults should also do strengthening exercises at least twice a week. This is in addition to the moderate-intensity exercise.  Maintain a healthy weight  Body mass index (BMI) is a measurement that can be used to identify possible weight problems. It estimates body fat based on height and weight. Your health care provider can help determine your BMI and help you achieve or maintain a healthy weight.  For females 1 years of age and older: ? A BMI below 18.5 is considered underweight. ? A BMI of 18.5 to 24.9 is normal. ? A BMI of 25 to 29.9 is considered overweight. ? A BMI of 30 and above is considered obese.  Watch levels of cholesterol and blood lipids  You should start having your blood tested for lipids and cholesterol at 41 years of age, then have this test every 5 years.  You may need to have your cholesterol  levels checked more often if: ? Your lipid or cholesterol levels are high. ? You are older than 41 years of age. ? You are at high risk for heart disease.  Cancer screening Lung Cancer  Lung cancer screening is recommended for adults 62-83 years old who are at high risk for lung cancer because of a history of smoking.  A yearly low-dose CT scan of the lungs is recommended for people who: ? Currently smoke. ? Have quit within the past 15 years. ? Have at least a 30-pack-year history of smoking. A pack year is smoking an average of one pack of cigarettes a day for 1 year.  Yearly screening should continue until it has been 15 years since you quit.  Yearly screening should stop if you develop a health problem that would prevent you from having lung cancer treatment.  Breast Cancer  Practice breast self-awareness. This means understanding how your breasts normally appear and feel.  It also means doing regular breast self-exams. Let your health care provider know about any changes, no matter how small.  If you are in your 20s or 30s, you should have a clinical breast exam (CBE) by a health care provider every 1-3 years as part of a regular health exam.  If you are 64 or older, have a CBE every year. Also consider having a breast X-ray (mammogram) every year.  If you have a family history of breast cancer, talk to your health care provider  provider about genetic screening.  If you are at high risk for breast cancer, talk to your health care provider about having an MRI and a mammogram every year.  Breast cancer gene (BRCA) assessment is recommended for women who have family members with BRCA-related cancers. BRCA-related cancers include: ? Breast. ? Ovarian. ? Tubal. ? Peritoneal cancers.  Results of the assessment will determine the need for genetic counseling and BRCA1 and BRCA2 testing.  Cervical Cancer Your health care provider may recommend that you be screened regularly for cancer of  the pelvic organs (ovaries, uterus, and vagina). This screening involves a pelvic examination, including checking for microscopic changes to the surface of your cervix (Pap test). You may be encouraged to have this screening done every 3 years, beginning at age 21.  For women ages 30-65, health care providers may recommend pelvic exams and Pap testing every 3 years, or they may recommend the Pap and pelvic exam, combined with testing for human papilloma virus (HPV), every 5 years. Some types of HPV increase your risk of cervical cancer. Testing for HPV may also be done on women of any age with unclear Pap test results.  Other health care providers may not recommend any screening for nonpregnant women who are considered low risk for pelvic cancer and who do not have symptoms. Ask your health care provider if a screening pelvic exam is right for you.  If you have had past treatment for cervical cancer or a condition that could lead to cancer, you need Pap tests and screening for cancer for at least 20 years after your treatment. If Pap tests have been discontinued, your risk factors (such as having a new sexual partner) need to be reassessed to determine if screening should resume. Some women have medical problems that increase the chance of getting cervical cancer. In these cases, your health care provider may recommend more frequent screening and Pap tests.  Colorectal Cancer  This type of cancer can be detected and often prevented.  Routine colorectal cancer screening usually begins at 41 years of age and continues through 41 years of age.  Your health care provider may recommend screening at an earlier age if you have risk factors for colon cancer.  Your health care provider may also recommend using home test kits to check for hidden blood in the stool.  A small camera at the end of a tube can be used to examine your colon directly (sigmoidoscopy or colonoscopy). This is done to check for the  earliest forms of colorectal cancer.  Routine screening usually begins at age 50.  Direct examination of the colon should be repeated every 5-10 years through 41 years of age. However, you may need to be screened more often if early forms of precancerous polyps or small growths are found.  Skin Cancer  Check your skin from head to toe regularly.  Tell your health care provider about any new moles or changes in moles, especially if there is a change in a mole's shape or color.  Also tell your health care provider if you have a mole that is larger than the size of a pencil eraser.  Always use sunscreen. Apply sunscreen liberally and repeatedly throughout the day.  Protect yourself by wearing long sleeves, pants, a wide-brimmed hat, and sunglasses whenever you are outside.  Heart disease, diabetes, and high blood pressure  High blood pressure causes heart disease and increases the risk of stroke. High blood pressure is more likely to develop in: ?   People who have blood pressure in the high end of the normal range (130-139/85-89 mm Hg). ? People who are overweight or obese. ? People who are African American.  If you are 18-39 years of age, have your blood pressure checked every 3-5 years. If you are 40 years of age or older, have your blood pressure checked every year. You should have your blood pressure measured twice-once when you are at a hospital or clinic, and once when you are not at a hospital or clinic. Record the average of the two measurements. To check your blood pressure when you are not at a hospital or clinic, you can use: ? An automated blood pressure machine at a pharmacy. ? A home blood pressure monitor.  If you are between 55 years and 79 years old, ask your health care provider if you should take aspirin to prevent strokes.  Have regular diabetes screenings. This involves taking a blood sample to check your fasting blood sugar level. ? If you are at a normal weight and  have a low risk for diabetes, have this test once every three years after 41 years of age. ? If you are overweight and have a high risk for diabetes, consider being tested at a younger age or more often. Preventing infection Hepatitis B  If you have a higher risk for hepatitis B, you should be screened for this virus. You are considered at high risk for hepatitis B if: ? You were born in a country where hepatitis B is common. Ask your health care provider which countries are considered high risk. ? Your parents were born in a high-risk country, and you have not been immunized against hepatitis B (hepatitis B vaccine). ? You have HIV or AIDS. ? You use needles to inject street drugs. ? You live with someone who has hepatitis B. ? You have had sex with someone who has hepatitis B. ? You get hemodialysis treatment. ? You take certain medicines for conditions, including cancer, organ transplantation, and autoimmune conditions.  Hepatitis C  Blood testing is recommended for: ? Everyone born from 1945 through 1965. ? Anyone with known risk factors for hepatitis C.  Sexually transmitted infections (STIs)  You should be screened for sexually transmitted infections (STIs) including gonorrhea and chlamydia if: ? You are sexually active and are younger than 41 years of age. ? You are older than 41 years of age and your health care provider tells you that you are at risk for this type of infection. ? Your sexual activity has changed since you were last screened and you are at an increased risk for chlamydia or gonorrhea. Ask your health care provider if you are at risk.  If you do not have HIV, but are at risk, it may be recommended that you take a prescription medicine daily to prevent HIV infection. This is called pre-exposure prophylaxis (PrEP). You are considered at risk if: ? You are sexually active and do not regularly use condoms or know the HIV status of your partner(s). ? You take drugs by  injection. ? You are sexually active with a partner who has HIV.  Talk with your health care provider about whether you are at high risk of being infected with HIV. If you choose to begin PrEP, you should first be tested for HIV. You should then be tested every 3 months for as long as you are taking PrEP. Pregnancy  If you are premenopausal and you may become pregnant, ask your health   care provider about preconception counseling.  If you may become pregnant, take 400 to 800 micrograms (mcg) of folic acid every day.  If you want to prevent pregnancy, talk to your health care provider about birth control (contraception). Osteoporosis and menopause  Osteoporosis is a disease in which the bones lose minerals and strength with aging. This can result in serious bone fractures. Your risk for osteoporosis can be identified using a bone density scan.  If you are 38 years of age or older, or if you are at risk for osteoporosis and fractures, ask your health care provider if you should be screened.  Ask your health care provider whether you should take a calcium or vitamin D supplement to lower your risk for osteoporosis.  Menopause may have certain physical symptoms and risks.  Hormone replacement therapy may reduce some of these symptoms and risks. Talk to your health care provider about whether hormone replacement therapy is right for you. Follow these instructions at home:  Schedule regular health, dental, and eye exams.  Stay current with your immunizations.  Do not use any tobacco products including cigarettes, chewing tobacco, or electronic cigarettes.  If you are pregnant, do not drink alcohol.  If you are breastfeeding, limit how much and how often you drink alcohol.  Limit alcohol intake to no more than 1 drink per day for nonpregnant women. One drink equals 12 ounces of beer, 5 ounces of wine, or 1 ounces of hard liquor.  Do not use street drugs.  Do not share needles.  Ask  your health care provider for help if you need support or information about quitting drugs.  Tell your health care provider if you often feel depressed.  Tell your health care provider if you have ever been abused or do not feel safe at home. This information is not intended to replace advice given to you by your health care provider. Make sure you discuss any questions you have with your health care provider. Document Released: 07/28/2010 Document Revised: 06/20/2015 Document Reviewed: 10/16/2014 Elsevier Interactive Patient Education  [employer].

## 2016-08-18 NOTE — Progress Notes (Signed)
Subjective:     Jasmine Gomez is a 41 y.o. female and is here for a comprehensive physical exam. The patient reports no problems.  Social History   Social History  . Marital status: Married    Spouse name: Legrand Como  . Number of children: 2  . Years of education: 51   Occupational History  . teacher     [place]   Social History Main Topics  . Smoking status: Former Smoker    Years: 10.00    Quit date: 01/26/2009  . Smokeless tobacco: Not on file  . Alcohol use 1.2 oz/week    2 Glasses of wine per week  . Drug use: No  . Sexual activity: Yes    Partners: Male    Birth control/ protection: Condom   Other Topics Concern  . Not on file   Social History Narrative   No regular exercise.     Health Maintenance  Topic Date Due  . INFLUENZA VACCINE  08/26/2016  . PAP SMEAR  08/27/2018  . TETANUS/TDAP  06/02/2023  . HIV Screening  Completed    The following portions of the patient's history were reviewed and updated as appropriate: allergies, current medications, past family history, past medical history, past social history, past surgical history and problem list.  Review of Systems A comprehensive review of systems was negative.   Objective:    BP 122/71   Pulse 68   Wt 165 lb (74.8 kg)   BMI 26.63 kg/m  General appearance: alert, cooperative and appears stated age Head: Normocephalic, without obvious abnormality, atraumatic Eyes: conj clear, EOMI, PEERLA Ears: normal TM's and external ear canals both ears Nose: Nares normal. Septum midline. Mucosa normal. No drainage or sinus tenderness. Throat: lips, mucosa, and tongue normal; teeth and gums normal Neck: no adenopathy, no carotid bruit, no JVD, supple, symmetrical, trachea midline and thyroid not enlarged, symmetric, no tenderness/mass/nodules Back: symmetric, no curvature. ROM normal. No CVA tenderness. Lungs: clear to auscultation bilaterally Heart: regular rate and rhythm, S1, S2 normal, no  murmur, click, rub or gallop Abdomen: soft, non-tender; bowel sounds normal; no masses,  no organomegaly Extremities: extremities normal, atraumatic, no cyanosis or edema Pulses: 2+ and symmetric Skin: Skin color, texture, turgor normal. No rashes or lesions Lymph nodes: Cervical adenopathy: nl and Axillary adenopathy: not examined Neurologic: Alert and oriented X 3, normal strength and tone. Normal symmetric reflexes. Normal coordination and gait    Assessment:    Healthy female exam.      Plan:     See After Visit Summary for Counseling Recommendations   Keep up a regular exercise program and make sure you are eating a healthy diet Try to eat 4 servings of dairy a day, or if you are lactose intolerant take a calcium with vitamin D daily.  Your vaccines are up to date.   Her periods have been somewhat irregular. Normally she is very regularly over the last year she's actually only had 3 menstrual cycles. She's had more difficulty losing weight over that time. She did reach outer her OB/GYN. They suggested that she could be experiencing perimenopause. She's also noticed some more dry skin.  Order for mammogram placed.

## 2016-08-21 ENCOUNTER — Ambulatory Visit (INDEPENDENT_AMBULATORY_CARE_PROVIDER_SITE_OTHER): Payer: BC Managed Care – PPO

## 2016-08-21 DIAGNOSIS — Z1231 Encounter for screening mammogram for malignant neoplasm of breast: Secondary | ICD-10-CM

## 2016-08-21 LAB — COMPLETE METABOLIC PANEL WITH GFR
ALT: 13 U/L (ref 6–29)
AST: 13 U/L (ref 10–30)
Albumin: 4.2 g/dL (ref 3.6–5.1)
Alkaline Phosphatase: 47 U/L (ref 33–115)
BILIRUBIN TOTAL: 0.7 mg/dL (ref 0.2–1.2)
BUN: 12 mg/dL (ref 7–25)
CO2: 25 mmol/L (ref 20–31)
Calcium: 9.1 mg/dL (ref 8.6–10.2)
Chloride: 103 mmol/L (ref 98–110)
Creat: 0.73 mg/dL (ref 0.50–1.10)
GFR, Est African American: >89 mL/min (ref >=60)
Glucose, Bld: 90 mg/dL (ref 65–99)
Potassium: 4 mmol/L (ref 3.5–5.3)
Sodium: 138 mmol/L (ref 135–146)
Total Protein: 6.4 g/dL (ref 6.1–8.1)

## 2016-08-21 LAB — LIPID PANEL W/REFLEX DIRECT LDL
Cholesterol: 246 mg/dL — ABNORMAL HIGH (ref <200)
HDL: 44 mg/dL — ABNORMAL LOW (ref >50)
LDL-CHOLESTEROL: 163 mg/dL — AB
Non-HDL Cholesterol (Calc): 202 mg/dL — ABNORMAL HIGH (ref <130)
Total CHOL/HDL Ratio: 5.6 Ratio — ABNORMAL HIGH (ref <5.0)
Triglycerides: 217 mg/dL — ABNORMAL HIGH (ref <150)

## 2016-08-21 LAB — TSH: TSH: 1.89 mIU/L

## 2016-09-11 ENCOUNTER — Ambulatory Visit: Payer: BC Managed Care – PPO | Admitting: Family Medicine

## 2016-09-11 DIAGNOSIS — Z0189 Encounter for other specified special examinations: Secondary | ICD-10-CM

## 2016-10-19 ENCOUNTER — Encounter: Payer: Self-pay | Admitting: Family Medicine

## 2016-10-19 ENCOUNTER — Ambulatory Visit (INDEPENDENT_AMBULATORY_CARE_PROVIDER_SITE_OTHER): Payer: BC Managed Care – PPO | Admitting: Family Medicine

## 2016-10-19 VITALS — BP 128/66 | HR 65 | Ht 66.0 in | Wt 167.0 lb

## 2016-10-19 DIAGNOSIS — R635 Abnormal weight gain: Secondary | ICD-10-CM

## 2016-10-19 DIAGNOSIS — E782 Mixed hyperlipidemia: Secondary | ICD-10-CM

## 2016-10-19 DIAGNOSIS — Z6826 Body mass index (BMI) 26.0-26.9, adult: Secondary | ICD-10-CM

## 2016-10-19 DIAGNOSIS — Z23 Encounter for immunization: Secondary | ICD-10-CM

## 2016-10-19 DIAGNOSIS — E785 Hyperlipidemia, unspecified: Secondary | ICD-10-CM | POA: Insufficient documentation

## 2016-10-19 NOTE — Progress Notes (Signed)
Subjective:    CC: cholesterol  HPI:  Hyperlipidemia - Just had labs done in July and wanted to go over his lipid results. She had some specific questions. As her LDL was elevated at 158. Her cholesterol overall was up from last year. Lab Results  Component Value Date   CHOL 246 (H) 08/21/2016   HDL 44 (L) 08/21/2016   LDLCALC 158 (H) 11/20/2014   TRIG 217 (H) 08/21/2016   CHOLHDL 5.6 (H) 08/21/2016   Has also gained about 15 lbs in the last year.  She is concerned about this.  Thyroid was normal. She hasn't been exercising as regularly. Since she got her cholesterol labs back she has really changed her diet and is trying to eat more vegetables and cut out red meat. She just feels very fatigued in the evenings.    Past medical history, Surgical history, Family history not pertinant except as noted below, Social history, Allergies, and medications have been entered into the medical record, reviewed, and corrections made.   Review of Systems: No fevers, chills, night sweats, weight loss, chest pain, or shortness of breath.   Objective:    General: Well Developed, well nourished, and in no acute distress.  Neuro: Alert and oriented x3, extra-ocular muscles intact, sensation grossly intact.  HEENT: Normocephalic, atraumatic  Skin: Warm and dry, no rashes. Cardiac: Regular rate and rhythm, no murmurs rubs or gallops, no lower extremity edema.  Respiratory: Clear to auscultation bilaterally. Not using accessory muscles, speaking in full sentences.   Impression and Recommendations:    Hyperlipidemia-Discussed that currently her 10 year risk is around 1.5% for cardiovascular disease in the next 10 year. This is low enough that she does not need to be on a statin but certainly needs to work on healthy diet regular exercise and weight loss to get her lipids down to at least where they were a year ago.  Abnormal weight gain - she is gain 15 pounds in the last year. We discussed strategies  around this including downloading a smart phone application to help retract calories and set goals. We also discussed increasing her protein in her diet. It doesn't sound like she skips meals. Adding veggies to her regimen and trying to get more active. If she still not succeeding in struggling with weight loss of happy to see her back in about 6-8 weeks to discuss other strategies.  Time spent 30 min , > 50% spent counseling about cholesterol and weight gain.

## 2016-12-04 ENCOUNTER — Telehealth: Payer: Self-pay | Admitting: Family Medicine

## 2016-12-04 NOTE — Telephone Encounter (Signed)
The following website has lots of options:  [email or web address]  Left VM that I had sent this info to the Pt via [place].

## 2016-12-04 NOTE — Telephone Encounter (Signed)
Kelsi, could you help me find some local support groups for parents of children with autism and depression.  Maybe we can start with downstairs since that psychiatrist downstairs does see children.   she may have some support groups that she regularly refers to.  If that is not helpful then we can call the [facility] in [place] that is [facility] and find out if they recommend any resources for support groups.  Once we have some information if you would mind contacting Davia with that information.  Please tell her I am sorry for taking so long to get back with her.

## 2017-02-10 ENCOUNTER — Ambulatory Visit: Payer: BC Managed Care – PPO | Admitting: Physician Assistant

## 2017-02-10 ENCOUNTER — Encounter: Payer: Self-pay | Admitting: Physician Assistant

## 2017-02-10 VITALS — BP 130/94 | HR 73 | Temp 98.2°F | Wt 166.0 lb

## 2017-02-10 DIAGNOSIS — R05 Cough: Secondary | ICD-10-CM | POA: Diagnosis not present

## 2017-02-10 DIAGNOSIS — R058 Other specified cough: Secondary | ICD-10-CM

## 2017-02-10 DIAGNOSIS — M62830 Muscle spasm of back: Secondary | ICD-10-CM

## 2017-02-10 DIAGNOSIS — J0110 Acute frontal sinusitis, unspecified: Secondary | ICD-10-CM

## 2017-02-10 DIAGNOSIS — M542 Cervicalgia: Secondary | ICD-10-CM | POA: Diagnosis not present

## 2017-02-10 MED ORDER — IPRATROPIUM BROMIDE 0.06 % NA SOLN: 1.0000 | Freq: Four times a day (QID) | NASAL | 0 refills | Status: DC | PRN

## 2017-02-10 MED ORDER — GUAIFENESIN ER 600 MG PO TB12: 600.0000 mg | ORAL_TABLET | Freq: Two times a day (BID) | ORAL | 0 refills | Status: DC

## 2017-02-10 MED ORDER — CYCLOBENZAPRINE HCL 5 MG PO TABS
5.0000 mg | ORAL_TABLET | Freq: Every evening | ORAL | 0 refills | Status: DC | PRN
Start: 2017-02-10 — End: 2017-03-01

## 2017-02-10 MED ORDER — CEFUROXIME AXETIL 250 MG PO TABS: 250.0000 mg | ORAL_TABLET | Freq: Two times a day (BID) | ORAL | 0 refills | Status: DC

## 2017-02-10 NOTE — Progress Notes (Signed)
HPI:                                                                Jasmine Gomez is a 42 y.o. female who presents to [facility] today for URI symptoms  URI   This is a new problem. The current episode started in the past 7 days. The problem has been unchanged. There has been no fever. Associated symptoms include congestion, coughing (wet, worse at night), headaches, neck pain, rhinorrhea and sinus pain. Pertinent negatives include no chest pain or wheezing. Treatments tried: Nyquil. The treatment provided moderate relief.  Neck Pain   This is a new problem. The current episode started yesterday. The problem occurs constantly. The problem has been unchanged. The pain is associated with a sleep position. The pain is present in the left side. The pain is moderate. The symptoms are aggravated by position. The pain is worse during the night. Associated symptoms include headaches. Pertinent negatives include no chest pain, fever, numbness, paresis or tingling. She has tried nothing for the symptoms.   No flowsheet data found.    Past Medical History:  Diagnosis Date  . Family history of breast cancer 11/16/2011   Mother and sister with  BrCa.  Patient is neg for BrCa gene.     Marland Kitchen Headache(784.0)   . Migraines    Past Surgical History:  Procedure Laterality Date  . NASAL SINUS SURGERY  1989  . TONSILLECTOMY  1988  . WISDOM TOOTH EXTRACTION     Social History   Tobacco Use  . Smoking status: Former Smoker    Years: 10.00    Last attempt to quit: 01/26/2009    Years since quitting: 8.0  . Smokeless tobacco: Never Used  Substance Use Topics  . Alcohol use: Yes    Alcohol/week: 1.2 oz    Types: 2 Glasses of wine per week   family history includes BRCA 1/2 in her mother and sister; Breast cancer (age of onset: 43) in her sister; Breast cancer (age of onset: 23) in her mother; Breast cancer (age of onset: 82) in her maternal  grandmother; Hyperlipidemia in her father.    ROS: negative except as noted in the HPI  Medications: No current outpatient medications on file.   No current facility-administered medications for this visit.    No Known Allergies     Objective:  BP (!) 130/94   Pulse 73   Temp 98.2 F (36.8 C) (Oral)   Wt 166 lb (75.3 kg)   SpO2 97%   BMI 26.79 kg/m  Gen:  alert, ill-appearing, not toxic-appearing, no distress, appropriate for age 72: head normocephalic without obvious abnormality, conjunctiva and cornea clear, wearing glasses, left frontal sinus tenderness,TM's clear bilaterally, nasal mucosa edemaotus with rhinorrhea, oropharynx clear, neck supple, no Meningeal signs, trachea midline, no adenopathy Pulm: Normal work of breathing, normal phonation, clear to auscultation bilaterally, no wheezes, rales or rhonchi CV: Normal rate, regular rhythm, s1 and s2 distinct, no murmurs, clicks or rubs  Neuro: alert and oriented x 3, no tremor MSK: tenderness of left trapezius and palpable spasm Skin: intact, no rashes on exposed skin, no jaundice, no cyanosis  No results found for this or any previous visit (from the past  72 hour(s)). No results found.    Assessment and Plan: 42 y.o. female with   1. Acute non-recurrent frontal sinusitis - cefUROXime (CEFTIN) 250 MG tablet; Take 1 tablet (250 mg total) by mouth 2 (two) times daily with a meal.  Dispense: 20 tablet; Refill: 0 - ipratropium (ATROVENT) 0.06 % nasal spray; Place 1 spray into both nostrils 4 (four) times daily as needed.  Dispense: 15 mL; Refill: 0  2. Cough productive of clear sputum - lungs are CTA, SpO2 97% on RA at rest. Afebrile, no tachycaria, no indication for chest x-ray today. Continue symptomatic management - guaiFENesin (MUCINEX) 600 MG 12 hr tablet; Take 1 tablet (600 mg total) by mouth 2 (two) times daily.  Dispense: 10 tablet; Refill: 0  3. Paraspinal muscle spasm - ice, ibuprofen as needed, muscle  relaxer at bedtime - cyclobenzaprine (FLEXERIL) 5 MG tablet; Take 1-2 tablets (5-10 mg total) by mouth at bedtime as needed for muscle spasms.  Dispense: 20 tablet; Refill: 0  4. Cervicalgia - cyclobenzaprine (FLEXERIL) 5 MG tablet; Take 1-2 tablets (5-10 mg total) by mouth at bedtime as needed for muscle spasms.  Dispense: 20 tablet; Refill: 0   Patient education and anticipatory guidance given Patient agrees with treatment plan Follow-up as needed if symptoms worsen or fail to improve  Darlyne Russian PA-C

## 2017-02-10 NOTE — Patient Instructions (Signed)
Sinus Headache A sinus headache occurs when the paranasal sinuses become clogged or swollen. Paranasal sinuses are air pockets within the bones of the face. Sinus headaches can range from mild to severe. What are the causes? A sinus headache can result from various conditions that affect the sinuses, such as:  Colds.  Sinus infections.  Allergies.  What are the signs or symptoms? The main symptom of this condition is a headache that may feel like pain or pressure in the face, forehead, ears, or upper teeth. People who have a sinus headache often have other symptoms, such as:  Congested or runny nose.  Fever.  Inability to smell.  Weather changes can make symptoms worse. How is this diagnosed? This condition may be diagnosed based on:  A physical exam and medical history.  Imaging tests, such as a CT scan and MRI, to check for problems with the sinuses.  A specialist may look into the sinuses with a tool that has a camera (endoscopy).  How is this treated? Treatment for this condition depends on the cause.  Sinus pain that is caused by a sinus infection may be treated with antibiotic medicine.  Sinus pain that is caused by allergies may be helped by allergy medicines (antihistamines) and medicated nasal sprays.  Sinus pain that is caused by congestion may be helped by flushing the nose and sinuses with saline solution.  Follow these instructions at home:  Take medicines only as directed by your health care provider.  If you were prescribed an antibiotic medicine, finish all of it even if you start to feel better.  If you have congestion, use a nasal spray to help reduce pressure.  If directed, apply a warm, moist washcloth to your face to help relieve pain. Contact a health care provider if:  You have headaches more than one time each week.  You have sensitivity to light or sound.  You have a fever.  You feel sick to your stomach (nauseous) or you throw up  (vomit).  Your headaches do not get better with treatment. Many people think that they have a sinus headache when they actually have migraines or tension headaches. Get help right away if:  You have vision problems.  You have sudden, severe pain in your face or head.  You have a seizure.  You are confused.  You have a stiff neck. This information is not intended to replace advice given to you by your health care provider. Make sure you discuss any questions you have with your health care provider. Document Released: 02/20/2004 Document Revised: 09/08/2015 Document Reviewed: 01/08/2014 Elsevier Interactive Patient Education  2018 [employer].    Muscle Cramps and Spasms Muscle cramps and spasms are when muscles tighten by themselves. They usually get better within minutes. Muscle cramps are painful. They are usually stronger and last longer than muscle spasms. Muscle spasms may or may not be painful. They can last a few seconds or much longer. Follow these instructions at home:  Drink enough fluid to keep your pee (urine) clear or pale yellow.  Massage, stretch, and relax the muscle.  If directed, apply heat to tight or tense muscles as often as told by your doctor. Use the heat source that your doctor recommends. ? Place a towel between your skin and the heat source. ? Leave the heat on for 20-30 minutes. ? Take off the heat if your skin turns bright red. This is especially important if you are unable to feel pain, heat, or  cold. You may have a greater risk of getting burned.  If directed, put ice on the affected area. This may help if you are sore or have pain after a cramp or spasm. ? Put ice in a plastic bag. ? Place a towel between your skin and the bag. ? Leave the ice on for 20 minutes, 2-3 times a day.  Take over-the-counter and prescription medicines only as told by your doctor.  Pay attention to any changes in your symptoms. Contact a doctor if:  Your cramps or  spasms get worse or happen more often.  Your cramps or spasms do not get better with time. This information is not intended to replace advice given to you by your health care provider. Make sure you discuss any questions you have with your health care provider. Document Released: 12/26/2007 Document Revised: 02/13/2015 Document Reviewed: 10/16/2014 Elsevier Interactive Patient Education  2018 [employer].

## 2017-03-01 ENCOUNTER — Ambulatory Visit: Payer: BC Managed Care – PPO | Admitting: Osteopathic Medicine

## 2017-03-01 ENCOUNTER — Encounter: Payer: Self-pay | Admitting: Osteopathic Medicine

## 2017-03-01 VITALS — BP 119/97 | HR 82 | Temp 98.5°F | Wt 163.1 lb

## 2017-03-01 DIAGNOSIS — J0110 Acute frontal sinusitis, unspecified: Secondary | ICD-10-CM

## 2017-03-01 MED ORDER — DOXYCYCLINE HYCLATE 100 MG PO TABS: 100.0000 mg | ORAL_TABLET | Freq: Two times a day (BID) | ORAL | 0 refills | Status: DC

## 2017-03-01 NOTE — Progress Notes (Signed)
HPI: Jasmine Gomez is a 42 y.o. female who  has a past medical history of Family history of breast cancer (11/16/2011), Headache(784.0), and Migraines.  she presents to [facility] today, 03/01/17,  for chief complaint of: Sinus   Recently seen 02/10/17 for similar. Abx Ceftin were given but pt not feeling much better. Concerned about fits of productive coughing, fatigue, sinus pressure/pain T-zone of the face. NKDA.     Past medical, surgical, social and family history reviewed:  Patient Active Problem List   Diagnosis Date Noted  . Hyperlipidemia 10/19/2016  . Family history of breast cancer 11/16/2011  . Migraine with aura 11/16/2011  . Allergic rhinitis, cause unspecified 09/05/2010    Past Surgical History:  Procedure Laterality Date  . NASAL SINUS SURGERY  1989  . TONSILLECTOMY  1988  . WISDOM TOOTH EXTRACTION      Social History   Tobacco Use  . Smoking status: Former Smoker    Years: 10.00    Last attempt to quit: 01/26/2009    Years since quitting: 8.0  . Smokeless tobacco: Never Used  Substance Use Topics  . Alcohol use: Yes    Alcohol/week: 1.2 oz    Types: 2 Glasses of wine per week    Family History  Problem Relation Age of Onset  . Breast cancer Mother 21       again 36  . BRCA 1/2 Mother   . Hyperlipidemia Father   . Breast cancer Sister 36       during pregnancy  . BRCA 1/2 Sister   . Breast cancer Maternal Grandmother 64     Current medication list and allergy/intolerance information reviewed:    Current Outpatient Medications  Medication Sig Dispense Refill  . cefUROXime (CEFTIN) 250 MG tablet Take 1 tablet (250 mg total) by mouth 2 (two) times daily with a meal. 20 tablet 0  . cyclobenzaprine (FLEXERIL) 5 MG tablet Take 1-2 tablets (5-10 mg total) by mouth at bedtime as needed for muscle spasms. 20 tablet 0  . guaiFENesin (MUCINEX) 600 MG 12 hr tablet Take 1 tablet (600 mg total) by mouth 2 (two)  times daily. 10 tablet 0  . ipratropium (ATROVENT) 0.06 % nasal spray Place 1 spray into both nostrils 4 (four) times daily as needed. 15 mL 0   No current facility-administered medications for this visit.     No Known Allergies    Review of Systems:  Constitutional:  No  fever, no chills, +recent illness, No unintentional weight changes. No significant fatigue.   HEENT: + headache, no vision change, no hearing change, No sore throat, +sinus pressure  Cardiac: No  chest pain, No  pressure, No palpitations, No  Orthopnea  Respiratory:  No  shortness of breath. +Cough  Gastrointestinal: No  abdominal pain, No  nausea, No  vomiting  Musculoskeletal: No new myalgia/arthralgia  Skin: No  Rash   Exam:  BP (!) 119/97   Pulse 82   Temp 98.5 F (36.9 C) (Oral)   Wt 163 lb 1.3 oz (74 kg)   SpO2 99%   BMI 26.32 kg/m   Constitutional: VS see above. General Appearance: alert, well-developed, well-nourished, NAD  Eyes: Normal lids and conjunctive, non-icteric sclera  Ears, Nose, Mouth, Throat: MMM, Normal external inspection ears/nares/mouth/lips/gums. TM normal bilaterally w/ clear fluid behind TM both sides. Pharynx/tonsils no erythema, no exudate. Nasal mucosa normal.   Neck: No masses, trachea midline. No thyroid enlargement. No tenderness/mass appreciated. No lymphadenopathy  Respiratory: Normal respiratory effort. no wheeze, no rhonchi, no rales  Cardiovascular: S1/S2 normal, no murmur, no rub/gallop auscultated. RRR.   Musculoskeletal: Gait normal.   Neurological: Normal balance/coordination. No tremor.   Skin: warm, dry, intact. No rash/ulcer.     ASSESSMENT/PLAN:   Acute non-recurrent frontal sinusitis   Meds ordered this encounter  Medications  . doxycycline (VIBRA-TABS) 100 MG tablet    Sig: Take 1 tablet (100 mg total) by mouth 2 (two) times daily.    Dispense:  14 tablet    Refill:  0     Patient Instructions  Persistent sinusitis - will escalate  antibiotics and see if this helps. If not improving by Day 5 on new medicines, call us and we will get a CT of the sinuses and consider ENT referral. Come see [place] sooner to reevaluate if worse/change.     Visit summary with medication list and pertinent instructions was printed for patient to review. All questions at time of visit were answered - patient instructed to contact office with any additional concerns. ER/RTC precautions were reviewed with the patient.   Follow-up plan: Return if symptoms worsen or fail to improve.  Please note: voice recognition software was used to produce this document, and typos may escape review. Please contact Dr. Sheppard Coil for any needed clarifications.

## 2017-03-01 NOTE — Patient Instructions (Signed)
Persistent sinusitis - will escalate antibiotics and see if this helps. If not improving by Day 5 on new medicines, call us and we will get a CT of the sinuses and consider ENT referral. Come see [place] sooner to reevaluate if worse/change.

## 2017-03-03 ENCOUNTER — Ambulatory Visit: Payer: BC Managed Care – PPO | Admitting: Obstetrics & Gynecology

## 2017-03-15 ENCOUNTER — Ambulatory Visit (INDEPENDENT_AMBULATORY_CARE_PROVIDER_SITE_OTHER): Payer: BC Managed Care – PPO | Admitting: Obstetrics & Gynecology

## 2017-03-15 ENCOUNTER — Encounter: Payer: Self-pay | Admitting: Obstetrics & Gynecology

## 2017-03-15 VITALS — BP 127/84 | HR 73 | Wt 162.0 lb

## 2017-03-15 DIAGNOSIS — Z124 Encounter for screening for malignant neoplasm of cervix: Secondary | ICD-10-CM

## 2017-03-15 DIAGNOSIS — Z1151 Encounter for screening for human papillomavirus (HPV): Secondary | ICD-10-CM | POA: Diagnosis not present

## 2017-03-15 DIAGNOSIS — R232 Flushing: Secondary | ICD-10-CM | POA: Diagnosis not present

## 2017-03-15 DIAGNOSIS — Z01419 Encounter for gynecological examination (general) (routine) without abnormal findings: Secondary | ICD-10-CM | POA: Diagnosis not present

## 2017-03-15 NOTE — Progress Notes (Signed)
fsh Subjective:    Jasmine Gomez is a 42 y.o. married P3 (3, 23 and 44 yo kids)  female who presents for an annual exam. She reports hot flashes, night sweats for a couple of years, worse since 11/18. No period since 11/18. Husband had a vasectomy.  The patient is sexually active. GYN screening history: last pap: was normal. The patient wears seatbelts: yes. The patient participates in regular exercise: no. Has the patient ever been transfused or tattooed?: no. The patient reports that there is not domestic violence in her life.   Menstrual History: OB History    Gravida Para Term Preterm AB Living   '3 3 3     3   ' SAB TAB Ectopic Multiple Live Births           56      Menarche age: 12 Patient's last menstrual period was 12/08/2016 (approximate).    The following portions of the patient's history were reviewed and updated as appropriate: allergies, current medications, past family history, past medical history, past social history, past surgical history and problem list.  Review of Systems Pertinent items are noted in HPI.   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 Madilyn Fireman does her fasting labs   Objective:    BP 127/84   Pulse 73   Wt 162 lb (73.5 kg)   LMP 12/08/2016 (Approximate)   BMI 26.15 kg/m   General Appearance:    Alert, cooperative, no distress, appears stated age  Head:    Normocephalic, without obvious abnormality, atraumatic  Eyes:    PERRL, conjunctiva/corneas clear, EOM's intact, fundi    benign, both eyes  Ears:    Normal TM's and external ear canals, both ears  Nose:   Nares normal, septum midline, mucosa normal, no drainage    or sinus tenderness  Throat:   Lips, mucosa, and tongue  normal; teeth and gums normal  Neck:   Supple, symmetrical, trachea midline, no adenopathy;    thyroid:  no enlargement/tenderness/nodules; no carotid   bruit or JVD  Back:     Symmetric, no curvature, ROM normal, no CVA tenderness  Lungs:     Clear to auscultation bilaterally, respirations unlabored  Chest Wall:    No tenderness or deformity   Heart:    Regular rate and rhythm, S1 and S2 normal, no murmur, rub   or gallop  Breast Exam:    No tenderness, masses, or nipple abnormality  Abdomen:     Soft, non-tender, bowel sounds active all four quadrants,    no masses, no organomegaly  Genitalia:    Normal female without lesion, discharge or tenderness, mild vulvovaginal atrophy,  normal size and shape, anteverted, mobile, non-tender, normal adnexal exam      Extremities:   Extremities normal, atraumatic, no cyanosis or edema  Pulses:   2+ and symmetric all extremities  Skin:   Skin color, texture, turgor normal, no rashes or lesions  Lymph nodes:   Cervical, supraclavicular, and axillary nodes normal  Neurologic:   CNII-XII intact, normal strength, sensation and reflexes    throughout  .    Assessment:    Healthy female exam.   Menopausal symptoms  Plan:     Thin prep Pap smear. with cotesting Check [facility]

## 2017-03-16 ENCOUNTER — Telehealth: Payer: Self-pay

## 2017-03-16 LAB — FOLLICLE STIMULATING HORMONE: FSH: 72.6 m[IU]/mL

## 2017-03-16 NOTE — Telephone Encounter (Signed)
Spoke with pt and she is aware that her [place] level is 72.6 which shows postmenopausal.

## 2017-03-18 LAB — CYTOLOGY - PAP
Diagnosis: NEGATIVE
HPV: NOT DETECTED

## 2017-04-01 ENCOUNTER — Encounter: Payer: Self-pay | Admitting: Family Medicine

## 2017-08-11 ENCOUNTER — Other Ambulatory Visit: Payer: Self-pay | Admitting: Family Medicine

## 2017-08-11 DIAGNOSIS — Z1239 Encounter for other screening for malignant neoplasm of breast: Secondary | ICD-10-CM

## 2017-08-19 ENCOUNTER — Encounter: Payer: BC Managed Care – PPO | Admitting: Family Medicine

## 2017-09-01 ENCOUNTER — Ambulatory Visit (INDEPENDENT_AMBULATORY_CARE_PROVIDER_SITE_OTHER): Payer: BC Managed Care – PPO

## 2017-09-01 DIAGNOSIS — Z1239 Encounter for other screening for malignant neoplasm of breast: Secondary | ICD-10-CM

## 2017-09-01 DIAGNOSIS — Z1231 Encounter for screening mammogram for malignant neoplasm of breast: Secondary | ICD-10-CM | POA: Diagnosis not present

## 2017-09-09 ENCOUNTER — Encounter: Payer: Self-pay | Admitting: Family Medicine

## 2017-09-09 ENCOUNTER — Ambulatory Visit (INDEPENDENT_AMBULATORY_CARE_PROVIDER_SITE_OTHER): Payer: BC Managed Care – PPO | Admitting: Family Medicine

## 2017-09-09 VITALS — BP 125/73 | HR 73 | Ht 66.0 in | Wt 166.0 lb

## 2017-09-09 DIAGNOSIS — Z23 Encounter for immunization: Secondary | ICD-10-CM

## 2017-09-09 DIAGNOSIS — R5383 Other fatigue: Secondary | ICD-10-CM | POA: Diagnosis not present

## 2017-09-09 DIAGNOSIS — Z0000 Encounter for general adult medical examination without abnormal findings: Secondary | ICD-10-CM | POA: Diagnosis not present

## 2017-09-09 DIAGNOSIS — L570 Actinic keratosis: Secondary | ICD-10-CM | POA: Diagnosis not present

## 2017-09-09 DIAGNOSIS — M2550 Pain in unspecified joint: Secondary | ICD-10-CM

## 2017-09-09 MED ORDER — FLUOROURACIL 2 % EX SOLN: 1.0000 "application " | Freq: Two times a day (BID) | CUTANEOUS | 0 refills | Status: DC

## 2017-09-09 MED ORDER — PHENTERMINE HCL 15 MG PO CAPS: 15.0000 mg | ORAL_CAPSULE | ORAL | 0 refills | Status: DC

## 2017-09-09 NOTE — Patient Instructions (Signed)

## 2017-09-09 NOTE — Progress Notes (Signed)
Subjective:     Jasmine Gomez is a 42 y.o. female and is here for a comprehensive physical exam. The patient reports no problems. She had not had a period since April and went to her gyn.  She had hormone testing and they diagnosed her with early menopause.  Only had 2 periods in the last year.  She just noticed over the last several months and that she is had more joint pain in general mostly over her neck.  She was having some pain over the tops of her feet but did get some new shoes and that has actually been better.  She did do yoga for a while and that was really helping her joints in her neck.  She is also had some pain over her MCPs and PIPs.  But no redness or swelling.  No family history of autoimmune disorder.  She is also been having some intermittent hip pain she is felt a little bit more fatigue but says she does not know how much of that is just from taking care of her 3 kids 1 of which has special needs.  Her husband was out of work for quite some time and having some medical issues.  He is now working again and that has really helped take some stress off.  She does have a couple of lesions one on her right upper arm and one near her right eyebrow which we have previously frozen the one her eye right eyebrow.  Wants to know what we can do to treat those.  Social History   Socioeconomic History  . Marital status: Married    Spouse name: Legrand Como  . Number of children: 2  . Years of education: 26  . Highest education level: Not on file  Occupational History  . Occupation: Pharmacist, hospital    Comment: Warehouse manager  Social Needs  . Financial resource strain: Not on file  . Food insecurity:    Worry: Not on file    Inability: Not on file  . Transportation needs:    Medical: Not on file    Non-medical: Not on file  Tobacco Use  . Smoking status: Former Smoker    Years: 10.00    Last attempt to quit: 01/26/2009    Years since quitting: 8.6  . Smokeless tobacco: Never Used   Substance and Sexual Activity  . Alcohol use: Yes    Alcohol/week: 2.0 standard drinks    Types: 2 Glasses of wine per week  . Drug use: No  . Sexual activity: Yes    Partners: Male    Birth control/protection: Condom  Lifestyle  . Physical activity:    Days per week: Not on file    Minutes per session: Not on file  . Stress: Not on file  Relationships  . Social connections:    Talks on phone: Not on file    Gets together: Not on file    Attends religious service: Not on file    Active member of club or organization: Not on file    Attends meetings of clubs or organizations: Not on file    Relationship status: Not on file  . Intimate partner violence:    Fear of current or ex partner: Not on file    Emotionally abused: Not on file    Physically abused: Not on file    Forced sexual activity: Not on file  Other Topics Concern  . Not on file  Social History Narrative   No  regular exercise.     Health Maintenance  Topic Date Due  . INFLUENZA VACCINE  08/26/2017  . PAP SMEAR  03/15/2020  . TETANUS/TDAP  06/02/2023  . HIV Screening  Completed    The following portions of the patient's history were reviewed and updated as appropriate: allergies, current medications, past family history, past medical history, past social history, past surgical history and problem list.  Review of Systems A comprehensive review of systems was negative.   Objective:    BP 125/73   Pulse 73   Ht 5\' 6"  (1.676 m)   Wt 166 lb (75.3 kg)   SpO2 100%   BMI 26.79 kg/m  General appearance: alert, cooperative and appears stated age Head: Normocephalic, without obvious abnormality, atraumatic Eyes: conj clear, EOM, PEERLA Ears: normal TM's and external ear canals both ears Nose: Nares normal. Septum midline. Mucosa normal. No drainage or sinus tenderness. Throat: lips, mucosa, and tongue normal; teeth and gums normal Neck: no adenopathy, no carotid bruit, no JVD, supple, symmetrical, trachea  midline and thyroid not enlarged, symmetric, no tenderness/mass/nodules Back: symmetric, no curvature. ROM normal. No CVA tenderness. Lungs: clear to auscultation bilaterally Heart: regular rate and rhythm, S1, S2 normal, no murmur, click, rub or gallop Abdomen: soft, non-tender; bowel sounds normal; no masses,  no organomegaly Extremities: extremities normal, atraumatic, no cyanosis or edema Pulses: 2+ and symmetric Skin: Skin color, texture, turgor normal. No rashes or lesions Lymph nodes: Cervical adenopathy: nl and Supraclavicular adenopathy: nl Neurologic: Alert and oriented X 3, normal strength and tone. Normal symmetric reflexes. Normal coordination and gait    Assessment:    Healthy female exam.     Plan:     See After Visit Summary for Counseling Recommendations   Keep up a regular exercise program and make sure you are eating a healthy diet Try to eat 4 servings of dairy a day, or if you are lactose intolerant take a calcium with vitamin D daily.  Your vaccines are up to date.   Joint pain-we discussed the importance of just stretching and regular exercise.  No sign of any type of autoimmune disorder at this point but if she starts noticing any redness or swelling of the joints then please come in for further evaluation.  Weight gain-we had actually discussed some strategies around weight loss last year when I saw her.  Unfortunately she has not been successful in losing any weight.  Though she admits she is not exercising regularly.  She struggles with balancing work and taking care of her 3 kids.  She is interested in possibly taking a medication to help her.  The options including a trial of low-dose phentermine in addition to regular exercise and healthy diet.  She feels like she makes really good food choices it just may be a little bit of the portion control and trying to get the exercise into her regular routine.  Need to follow-up in 1 month for blood pressure and weight  check with the nurse.  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

## 2017-09-10 ENCOUNTER — Encounter: Payer: Self-pay | Admitting: Family Medicine

## 2017-09-15 LAB — COMPLETE METABOLIC PANEL WITH GFR
AG RATIO: 2.1 (calc) (ref 1.0–2.5)
ALBUMIN MSPROF: 4.4 g/dL (ref 3.6–5.1)
ALT: 20 U/L (ref 6–29)
AST: 17 U/L (ref 10–30)
Alkaline phosphatase (APISO): 54 U/L (ref 33–115)
BUN: 17 mg/dL (ref 7–25)
CALCIUM: 9.5 mg/dL (ref 8.6–10.2)
CO2: 27 mmol/L (ref 20–32)
Chloride: 103 mmol/L (ref 98–110)
Creat: 0.84 mg/dL (ref 0.50–1.10)
GFR, EST AFRICAN AMERICAN: 100 mL/min/{1.73_m2} (ref >=60)
GFR, EST NON AFRICAN AMERICAN: 86 mL/min/{1.73_m2} (ref >=60)
GLOBULIN: 2.1 g/dL (ref 1.9–3.7)
Glucose, Bld: 89 mg/dL (ref 65–99)
Potassium: 4.1 mmol/L (ref 3.5–5.3)
SODIUM: 139 mmol/L (ref 135–146)
TOTAL PROTEIN: 6.5 g/dL (ref 6.1–8.1)
Total Bilirubin: 0.8 mg/dL (ref 0.2–1.2)

## 2017-09-15 LAB — LIPID PANEL
Cholesterol: 260 mg/dL — ABNORMAL HIGH (ref <200)
HDL: 52 mg/dL (ref >50)
LDL Cholesterol (Calc): 180 mg/dL (calc) — ABNORMAL HIGH
NON-HDL CHOLESTEROL (CALC): 208 mg/dL — AB (ref <130)
Total CHOL/HDL Ratio: 5 (calc) — ABNORMAL HIGH (ref <5.0)
Triglycerides: 142 mg/dL (ref <150)

## 2017-09-15 LAB — CBC
HCT: 41.1 % (ref 35.0–45.0)
Hemoglobin: 14 g/dL (ref 11.7–15.5)
MCH: 30.8 pg (ref 27.0–33.0)
MCHC: 34.1 g/dL (ref 32.0–36.0)
MCV: 90.5 fL (ref 80.0–100.0)
MPV: 9.6 fL (ref 7.5–12.5)
PLATELETS: 308 10*3/uL (ref 140–400)
RBC: 4.54 10*6/uL (ref 3.80–5.10)
RDW: 12.5 % (ref 11.0–15.0)
WBC: 5.9 10*3/uL (ref 3.8–10.8)

## 2017-09-15 LAB — MAGNESIUM: Magnesium: 1.8 mg/dL (ref 1.5–2.5)

## 2017-09-15 LAB — FERRITIN: Ferritin: 67 ng/mL (ref 16–232)

## 2017-09-15 LAB — TSH: TSH: 2.07 m[IU]/L

## 2017-09-15 LAB — FOLATE: Folate: 14.6 ng/mL

## 2017-09-15 LAB — VITAMIN D 25 HYDROXY (VIT D DEFICIENCY, FRACTURES): VIT D 25 HYDROXY: 32 ng/mL (ref 30–100)

## 2017-09-15 LAB — VITAMIN B12: Vitamin B-12: 962 pg/mL (ref 200–1100)

## 2017-09-17 ENCOUNTER — Other Ambulatory Visit: Payer: Self-pay | Admitting: *Deleted

## 2017-09-17 DIAGNOSIS — E782 Mixed hyperlipidemia: Secondary | ICD-10-CM

## 2018-02-08 ENCOUNTER — Ambulatory Visit: Payer: BC Managed Care – PPO | Admitting: Family Medicine

## 2018-02-18 ENCOUNTER — Ambulatory Visit (INDEPENDENT_AMBULATORY_CARE_PROVIDER_SITE_OTHER): Payer: BC Managed Care – PPO | Admitting: Family Medicine

## 2018-02-18 ENCOUNTER — Encounter: Payer: Self-pay | Admitting: Family Medicine

## 2018-02-18 VITALS — BP 117/77 | HR 71 | Temp 98.2°F | Ht 66.0 in | Wt 161.0 lb

## 2018-02-18 DIAGNOSIS — R221 Localized swelling, mass and lump, neck: Secondary | ICD-10-CM

## 2018-02-18 DIAGNOSIS — L723 Sebaceous cyst: Secondary | ICD-10-CM

## 2018-02-18 NOTE — Progress Notes (Signed)
 Acute Office Visit  Subjective:    Patient ID: Jasmine Gomez, female    DOB: 11/08/1975, 43 y.o.   MRN: [id number]  Chief Complaint  Patient presents with  . Sore Throat    x 4 days pt reports that its not     HPI Patient is in today feeling like there was a swollen area on the left side of her neck and underneath her jawline.  She had noticed a little bump behind her left ear that was swollen as well.  That has come and gone before and is not necessarily new.  She denies any fevers chills or sweats.  But has had a little bit of nasal congestion for the last 4 days as well.  She says it is a little uncomfortable when she swallows on that left side but not painful.  It has not kept her from eating.  Past Medical History:  Diagnosis Date  . Family history of breast cancer 11/16/2011   Mother and sister with  BrCa.  Patient is neg for BrCa gene.     . Headache(784.0)   . Migraines     Past Surgical History:  Procedure Laterality Date  . NASAL SINUS SURGERY  1989  . TONSILLECTOMY  1988  . WISDOM TOOTH EXTRACTION      Family History  Problem Relation Age of Onset  . Breast cancer Mother 48       again 53  . BRCA 1/2 Mother   . Hyperlipidemia Father   . Breast cancer Sister 29       during pregnancy  . BRCA 1/2 Sister   . Breast cancer Maternal Grandmother 64    Social History   Socioeconomic History  . Marital status: Married    Spouse name: Michael  . Number of children: 2  . Years of education: 17  . Highest education level: Not on file  Occupational History  . Occupation: teacher    Comment: [employer]  Social Needs  . Financial resource strain: Not on file  . Food insecurity:    Worry: Not on file    Inability: Not on file  . Transportation needs:    Medical: Not on file    Non-medical: Not on file  Tobacco Use  . Smoking status: Former Smoker    Years: 10.00    Last attempt to quit: 01/26/2009    Years since quitting: 9.0  . Smokeless  tobacco: Never Used  Substance and Sexual Activity  . Alcohol use: Yes    Alcohol/week: 2.0 standard drinks    Types: 2 Glasses of wine per week  . Drug use: No  . Sexual activity: Yes    Partners: Male    Birth control/protection: Condom  Lifestyle  . Physical activity:    Days per week: Not on file    Minutes per session: Not on file  . Stress: Not on file  Relationships  . Social connections:    Talks on phone: Not on file    Gets together: Not on file    Attends religious service: Not on file    Active member of club or organization: Not on file    Attends meetings of clubs or organizations: Not on file    Relationship status: Not on file  . Intimate partner violence:    Fear of current or ex partner: Not on file    Emotionally abused: Not on file    Physically abused: Not on file      Forced sexual activity: Not on file  Other Topics Concern  . Not on file  Social History Narrative   No regular exercise.      Outpatient Medications Prior to Visit  Medication Sig Dispense Refill  . Fluorouracil 2 % SOLN Apply 1 application topically 2 (two) times daily. 4-6 weeks. 10 mL 0  . phentermine 15 MG capsule Take 1 capsule (15 mg total) by mouth every morning. 30 capsule 0   No facility-administered medications prior to visit.     No Known Allergies  ROS     Objective:    Physical Exam  Constitutional: She is oriented to person, place, and time. She appears well-developed and well-nourished.  HENT:  Head: Normocephalic and atraumatic.  Right Ear: External ear normal.  Left Ear: External ear normal.  Nose: Nose normal.  Mouth/Throat: Oropharynx is clear and moist.  TMs and canals are clear.   Eyes: Pupils are equal, round, and reactive to light. Conjunctivae and EOM are normal.  Neck: Neck supple. No thyromegaly present.  She does have just a little bit of fullness in the left anterior upper neck near the jawline.  I do not feel any discrete lymph nodes or swollen  salivary glands.  No erythema.  She does have a borderline enlarged thyroid gland.  Cardiovascular: Normal rate, regular rhythm and normal heart sounds.  Pulmonary/Chest: Effort normal and breath sounds normal. She has no wheezes.  Lymphadenopathy:    She has no cervical adenopathy.  Neurological: She is alert and oriented to person, place, and time.  Skin: Skin is warm and dry.  Psychiatric: She has a normal mood and affect.    BP 117/77   Pulse 71   Temp 98.2 F (36.8 C)   Ht 5' 6" (1.676 m)   Wt 161 lb (73 kg)   SpO2 100%   BMI 25.99 kg/m  Wt Readings from Last 3 Encounters:  02/18/18 161 lb (73 kg)  09/09/17 166 lb (75.3 kg)  03/15/17 162 lb (73.5 kg)    There are no preventive care reminders to display for this patient.  There are no preventive care reminders to display for this patient.   Lab Results  Component Value Date   TSH 2.07 09/14/2017   Lab Results  Component Value Date   WBC 5.9 09/14/2017   HGB 14.0 09/14/2017   HCT 41.1 09/14/2017   MCV 90.5 09/14/2017   PLT 308 09/14/2017   Lab Results  Component Value Date   NA 139 09/14/2017   K 4.1 09/14/2017   CO2 27 09/14/2017   GLUCOSE 89 09/14/2017   BUN 17 09/14/2017   CREATININE 0.84 09/14/2017   BILITOT 0.8 09/14/2017   ALKPHOS 47 08/21/2016   AST 17 09/14/2017   ALT 20 09/14/2017   PROT 6.5 09/14/2017   ALBUMIN 4.2 08/21/2016   CALCIUM 9.5 09/14/2017   Lab Results  Component Value Date   CHOL 260 (H) 09/14/2017   Lab Results  Component Value Date   HDL 52 09/14/2017   Lab Results  Component Value Date   LDLCALC 180 (H) 09/14/2017   Lab Results  Component Value Date   TRIG 142 09/14/2017   Lab Results  Component Value Date   CHOLHDL 5.0 (H) 09/14/2017   No results found for: HGBA1C     Assessment & Plan:   Problem List Items Addressed This Visit    None    Visit Diagnoses    Neck swelling    -  Primary   Sebaceous cyst         She actually feels a little bit better  today like the area is less swollen.  Plus the cyst behind her left ear is also less swollen.  I suspect that it is likely viral and was probably more swelling and lymph nodes.  She did she is also had a little bit of nasal congestion.  Encouraged her to send me a note after the weekend if she feels like she is not continuing to improve or if she suddenly gets worse or develops more internal sore throat or swelling along the neck or jawline.  She is afebrile today which is reassuring.  She also let me know that she and her husband are likely going to move back to [place] hopefully in the next year or 2.  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    Catherine Metheney, MD 

## 2018-05-24 ENCOUNTER — Ambulatory Visit (INDEPENDENT_AMBULATORY_CARE_PROVIDER_SITE_OTHER): Payer: BC Managed Care – PPO | Admitting: Family Medicine

## 2018-05-24 ENCOUNTER — Encounter: Payer: Self-pay | Admitting: Family Medicine

## 2018-05-24 VITALS — Temp 98.1°F | Ht 66.0 in | Wt 163.2 lb

## 2018-05-24 DIAGNOSIS — F430 Acute stress reaction: Secondary | ICD-10-CM

## 2018-05-24 DIAGNOSIS — R002 Palpitations: Secondary | ICD-10-CM | POA: Diagnosis not present

## 2018-05-24 DIAGNOSIS — F411 Generalized anxiety disorder: Secondary | ICD-10-CM | POA: Diagnosis not present

## 2018-05-24 MED ORDER — ESCITALOPRAM OXALATE 10 MG PO TABS: ORAL_TABLET | ORAL | 1 refills | Status: DC

## 2018-05-24 NOTE — Progress Notes (Signed)
She has not tried any OTC medications. She has been experiencing night sweats more lately. At night after her kids are in bed her heart begins to race and this is a new symptom for her. She does have some SOB and will take some deep breaths to help calm down. This used to help her. She said that exercise helps her best but she has found this hard to do because of the weather.   Her husband works until 54 or 530 and this is also a cause of concern for her. Because he works for the [employer] and his possible exposure and the fact that he could also get laid off from his job.Jasmine Gomez, [name], CMA

## 2018-05-24 NOTE — Progress Notes (Signed)
Virtual Visit via Video Note  I connected with Jasmine Gomez on 05/24/18 at  9:50 AM EDT by a video enabled telemedicine application and verified that I am speaking with the correct person using two identifiers.   I discussed the limitations of evaluation and management by telemedicine and the availability of in person appointments. The patient expressed understanding and agreed to proceed.  Pt was at home and I was in my office for the virtual visit.     Subjective:    CC: Increased anxiety levels.   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  She has been experiencing night sweats more lately. At night after her kids are in bed her heart begins to race and this is a new symptom for her. She does have some SOB and will take some deep breaths to help calm down. This used to help her. She said that exercise helps her best but she has found this hard to do because of the weather. She has not tried any OTC medications. She feels menopause is contributing to her sxs.  She has been irritable with her kids.    Her husband works until 53 or 530 and this is also a cause of concern for her. Because he works for the [employer] and his possible exposure and the fact that he could also get laid off from his job..  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  One of he kids is special needs and she also has a 43 year old.    Past medical history, Surgical history, Family history not pertinant except as noted below, Social history, Allergies, and medications have been entered into the medical record, reviewed, and corrections made.   Review of Systems: No fevers, chills, night sweats, weight loss, chest pain, or shortness of breath.   Objective:    General: Speaking clearly in complete sentences without any shortness of breath.  Alert and oriented x3.  Normal judgment. No apparent acute distress. Well groomed.    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  Her BP is up more than normal and having the heart racing at night.  Discussed options of medication vs therapy and counseling. She is OK with starting medicaiton. Discussed potential side effects.  F/u in 2-3 weeks. Will check to see if Juliann Pulse Carrier who she had seen in the past is doing virtual visits.    Palpitations at night - likely stress induced but if not improving recommend further workup.   Acute stress - let's stat Lexapro.  Will f/U in 2-3 weeks.        I discussed the assessment and treatment plan with the patient. The patient was provided an opportunity to ask questions and all were answered. The patient agreed with the plan and demonstrated an understanding of the instructions.   The patient was advised to call back or seek an in-person evaluation if the symptoms worsen or if the condition fails to improve as anticipated.   Beatrice Lecher, MD

## 2018-06-08 ENCOUNTER — Encounter: Payer: Self-pay | Admitting: Family Medicine

## 2018-06-09 ENCOUNTER — Ambulatory Visit (INDEPENDENT_AMBULATORY_CARE_PROVIDER_SITE_OTHER): Payer: BC Managed Care – PPO | Admitting: Family Medicine

## 2018-06-09 ENCOUNTER — Encounter: Payer: Self-pay | Admitting: Family Medicine

## 2018-06-09 VITALS — Temp 99.0°F | Ht 66.0 in | Wt 167.0 lb

## 2018-06-09 DIAGNOSIS — L237 Allergic contact dermatitis due to plants, except food: Secondary | ICD-10-CM

## 2018-06-09 MED ORDER — TRIAMCINOLONE ACETONIDE 0.1 % EX CREA: 1.0000 "application " | TOPICAL_CREAM | Freq: Two times a day (BID) | CUTANEOUS | 1 refills | Status: DC

## 2018-06-09 NOTE — Progress Notes (Signed)
Virtual Visit via Video Note  I connected with Jasmine Gomez on 06/09/18 at 11:30 AM EDT by a video enabled telemedicine application and verified that I am speaking with the correct person using two identifiers.   I discussed the limitations of evaluation and management by telemedicine and the availability of in person appointments. The patient expressed understanding and agreed to proceed.  Subjective:    CC: Poison ivy  HPI: Pt reports that she was pruning a tree 3 days ago. The rash is on her neck, around her eye, hand and its beginning to spread more to her face. Near her left eye.  She has been using cortisone 1%. This has helped the itchiness. She has taken oral benadryl.she says the rash in on her face, neck legs and right hand.     Past medical history, Surgical history, Family history not pertinant except as noted below, Social history, Allergies, and medications have been entered into the medical record, reviewed, and corrections made.   Review of Systems: No fevers, chills, night sweats, weight loss, chest pain, or shortness of breath.   Objective:    General: Speaking clearly in complete sentences without any shortness of breath.  Alert and oriented x3.  Normal judgment. No apparent acute distress.she has some erythematous puffy areas around her left eye on the side of her neck and on her right hand.     Impression and Recommendations:   Poison ivy - discussed tx options. Can use oral steroids vs topical. She prefer to have the topical.  Call if not improving. Don't use steroid for more than 2 weeks. discused washing clothes that she wore outdoors. Explained can have new lesios even a week or so after exposure.         I discussed the assessment and treatment plan with the patient. The patient was provided an opportunity to ask questions and all were answered. The patient agreed with the plan and demonstrated an understanding of the instructions.   The patient was  advised to call back or seek an in-person evaluation if the symptoms worsen or if the condition fails to improve as anticipated.   Beatrice Lecher, MD

## 2018-06-09 NOTE — Patient Instructions (Signed)
Poison Ivy Dermatitis  Poison ivy dermatitis is inflammation of the skin that is caused by the allergens on the leaves of the poison ivy plant. The skin reaction often involves redness, swelling, blisters, and extreme itching. What are the causes? This condition is caused by a specific chemical (urushiol) found in the sap of the poison ivy plant. This chemical is sticky and can be easily spread to people, animals, and objects. You can get poison ivy dermatitis by:  Having direct contact with a poison ivy plant.  Touching animals, other people, or objects that have come in contact with poison ivy and have the chemical on them. What increases the risk? This condition is more likely to develop in:  People who are outdoors often.  People who go outdoors without wearing protective clothing, such as closed shoes, long pants, and a long-sleeved shirt. What are the signs or symptoms? Symptoms of this condition include:  Redness and itching.  A rash that often includes bumps and blisters. The rash usually appears 48 hours after exposure.  Swelling. This may occur if the reaction is more severe. Symptoms usually last for 1-2 weeks. However, the first time you develop this condition, symptoms may last 3-4 weeks. How is this diagnosed? This condition may be diagnosed based on your symptoms and a physical exam. Your health care provider may also ask you about any recent outdoor activity. How is this treated? Treatment for this condition will vary depending on how severe it is. Treatment may include:  Hydrocortisone creams or calamine lotions to relieve itching.  Oatmeal baths to soothe the skin.  Over-the-counter antihistamine tablets.  Oral steroid medicine for more severe outbreaks. Follow these instructions at home:  Take or apply over-the-counter and prescription medicines only as told by your health care provider.  Wash exposed skin as soon as possible with soap and cold water.  Use  hydrocortisone creams or calamine lotion as needed to soothe the skin and relieve itching.  Take oatmeal baths as needed. Use colloidal oatmeal. You can get this at your local pharmacy or grocery store. Follow the instructions on the packaging.  Do not scratch or rub your skin.  While you have the rash, wash clothes right after you wear them. How is this prevented?   Learn to identify the poison ivy plant and avoid contact with the plant. This plant can be recognized by the number of leaves. Generally, poison ivy has three leaves with flowering branches on a single stem. The leaves are typically glossy, and they have jagged edges that come to a point at the front.  If you have been exposed to poison ivy, thoroughly wash with soap and water right away. You have about 30 minutes to remove the plant resin before it will cause the rash. Be sure to wash under your fingernails because any plant resin there will continue to spread the rash.  When hiking or camping, wear clothes that will help you to avoid exposure on the skin. This includes long pants, a long-sleeved shirt, tall socks, and hiking boots. You can also apply preventive lotion to your skin to help limit exposure.  If you suspect that your clothes or outdoor gear came in contact with poison ivy, rinse them off outside with a garden hose before you bring them inside your house. Contact a health care provider if:  You have open sores in the rash area.  You have more redness, swelling, or pain in the affected area.  You have redness that   spreads beyond the rash area.  You have fluid, blood, or pus coming from the affected area.  You have a fever.  You have a rash over a large area of your body.  You have a rash on your eyes, mouth, or genitals.  Your rash does not improve after a few days. Get help right away if:  Your face swells or your eyes swell shut.  You have trouble breathing.  You have trouble swallowing. This  information is not intended to replace advice given to you by your health care provider. Make sure you discuss any questions you have with your health care provider. Document Released: 01/10/2000 Document Revised: 06/25/2016 Document Reviewed: 06/20/2014 Elsevier Interactive Patient Education  2019 [employer] Inc.  

## 2018-06-09 NOTE — Progress Notes (Signed)
Pt reports that she was pruning a tree 2 days ago. The rash is on her neck, around her eye, hand and its beginning to spread more to her face. She has been using cortisone 1%. This has helped the itchiness. She has taken oral benadryl.   No other sxs. Maryruth Eve, [name], CMA

## 2018-06-12 ENCOUNTER — Encounter: Payer: Self-pay | Admitting: Family Medicine

## 2018-06-13 ENCOUNTER — Encounter: Payer: Self-pay | Admitting: Family Medicine

## 2018-06-13 MED ORDER — PREDNISONE 20 MG PO TABS: ORAL_TABLET | ORAL | 0 refills | Status: AC

## 2018-06-14 ENCOUNTER — Encounter: Payer: Self-pay | Admitting: Family Medicine

## 2018-06-15 ENCOUNTER — Other Ambulatory Visit: Payer: Self-pay | Admitting: Family Medicine

## 2018-06-16 ENCOUNTER — Encounter: Payer: Self-pay | Admitting: Family Medicine

## 2018-06-16 ENCOUNTER — Ambulatory Visit (INDEPENDENT_AMBULATORY_CARE_PROVIDER_SITE_OTHER): Payer: BC Managed Care – PPO | Admitting: Family Medicine

## 2018-06-16 VITALS — Temp 98.6°F | Ht 66.0 in | Wt 164.0 lb

## 2018-06-16 DIAGNOSIS — R002 Palpitations: Secondary | ICD-10-CM

## 2018-06-16 DIAGNOSIS — F430 Acute stress reaction: Secondary | ICD-10-CM | POA: Diagnosis not present

## 2018-06-16 DIAGNOSIS — F411 Generalized anxiety disorder: Secondary | ICD-10-CM | POA: Diagnosis not present

## 2018-06-16 NOTE — Progress Notes (Signed)
She reports that she has seen a difference.  She also c/o experiencing a migraine today she said that this sometimes happens once a year.Maryruth Eve, [name], CMA

## 2018-06-16 NOTE — Progress Notes (Signed)
Virtual Visit via Video Note  I connected with Jasmine Gomez on 06/16/18 at  3:20 PM EDT by a video enabled telemedicine application and verified that I am speaking with the correct person using two identifiers.   I discussed the limitations of evaluation and management by telemedicine and the availability of in person appointments. The patient expressed understanding and agreed to proceed.  Pt was at home and I was in my office for the virtual visit.     Subjective:    CC: Anxiety  HPI: F/U anxiety - when I last saw her she was really struggling with her anxiety.  She has been experiencing night sweats and palpitations and occ SOB.  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 We started Lexapro about 3 weeks ago.  She says the heart racing is better.  She has noticed some easy bruising.    She has also had a bout of poison ivy about a week ago.  She tried topical and then oral steroids.    She also had a migraines today. She doesn't get those every often. Says after having children they are much less frequent.  Not on a prescriptin rescue med or prohpylaxis but was years ago. Just uses excedrin migraine and tries to sleep.    Past medical history, Surgical history, Family history not pertinant except as noted below, Social history, Allergies, and medications have been entered into the medical record, reviewed, and corrections made.   Review of Systems: No fevers, chills, night sweats, weight loss, chest pain, or shortness of breath.   Objective:    General: Speaking clearly in complete sentences without any shortness of breath.  Alert and oriented x3.  Normal judgment. No apparent acute distress. Well groomed.     Impression and Recommendations:   GAD/acute stress - Her GAD -7 score was 19 and is now down to 4.  Plan to continue for the next couple of months.    Palpitations - Much better as her anxiety has improved.  Call if sxs return.    Migraine  HA - doing OK. Only gets about once a year.  Says OK with excedrin migriane for now.         I discussed the assessment and treatment plan with the patient. The patient was provided an opportunity to ask questions and all were answered. The patient agreed with the plan and demonstrated an understanding of the instructions.   The patient was advised to call back or seek an in-person evaluation if the symptoms worsen or if the condition fails to improve as anticipated.   Beatrice Lecher, MD

## 2018-07-05 ENCOUNTER — Encounter: Payer: Self-pay | Admitting: Family Medicine

## 2018-07-05 MED ORDER — PREDNISONE 20 MG PO TABS: 20.0000 mg | ORAL_TABLET | Freq: Every day | ORAL | 0 refills | Status: DC

## 2018-09-19 ENCOUNTER — Other Ambulatory Visit: Payer: Self-pay | Admitting: Family Medicine

## 2018-09-29 ENCOUNTER — Telehealth: Payer: Self-pay | Admitting: Family Medicine

## 2018-09-29 ENCOUNTER — Telehealth (INDEPENDENT_AMBULATORY_CARE_PROVIDER_SITE_OTHER): Payer: BC Managed Care – PPO | Admitting: Family Medicine

## 2018-09-29 ENCOUNTER — Encounter: Payer: Self-pay | Admitting: Family Medicine

## 2018-09-29 VITALS — BP 118/81 | HR 71 | Temp 98.9°F | Ht 66.0 in | Wt 168.0 lb

## 2018-09-29 DIAGNOSIS — F430 Acute stress reaction: Secondary | ICD-10-CM | POA: Diagnosis not present

## 2018-09-29 DIAGNOSIS — L989 Disorder of the skin and subcutaneous tissue, unspecified: Secondary | ICD-10-CM

## 2018-09-29 DIAGNOSIS — L988 Other specified disorders of the skin and subcutaneous tissue: Secondary | ICD-10-CM

## 2018-09-29 DIAGNOSIS — F411 Generalized anxiety disorder: Secondary | ICD-10-CM

## 2018-09-29 NOTE — Progress Notes (Signed)
Established Patient Office Visit  Subjective:  Patient ID: Jasmine Gomez, female    DOB: 1975-08-05  Age: 43 y.o. MRN: [id number]  CC:  Chief Complaint  Patient presents with  . mood    little groggy but otherwise doing well.     HPI Jasmine Gomez presents for GAD.  Overall she is doing well.  She says in fact when she called to make the visit she was thinking about actually weaning down on her medication because she had been doing really well over the summer.  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  He has been out of work so he is been able to do this.  But now he just got a new job today and will be starting on the 21st so she will have to start working back in the house with her 3 children as well as helping them with the learning.  And she has 1 child with special needs.  So she thinks at this point she would rather stay with the Lexapro.  Her only complaint about it is that it does make her feel little tired first thing in the morning.  When she would take in the morning she would feel really sedated but now that she takes it at bedtime she will wake up in the morning and it just takes her out of feel like she needs to get going like she needs a couple coffee.  She says that usually when she gets home from doing the ED learning she will take a nap maybe for about an hour but then she will get charged and not 1 to go to bed until about midnight and has to get up at 6 AM.  So right now her sleep is also a little bit disjointed.  She also feels like she needs to see dermatology.  She has very fair skinned and freckled and just feels like she has been getting more lesions as she gets older.  She has a lesion on her upper lip that stays a little irritated and scaly and occasionally bleeds.  Past Medical History:  Diagnosis Date  . Family history of breast cancer 11/16/2011    Mother and sister with  BrCa.  Patient is neg for BrCa gene.     Marland Kitchen Headache(784.0)   . Migraines     Past Surgical History:  Procedure Laterality Date  . NASAL SINUS SURGERY  1989  . TONSILLECTOMY  1988  . WISDOM TOOTH EXTRACTION      Family History  Problem Relation Age of Onset  . Breast cancer Mother 11       again 70  . BRCA 1/2 Mother   . Hyperlipidemia Father   . Breast cancer Sister 76       during pregnancy  . BRCA 1/2 Sister   . Breast cancer Maternal Grandmother 64    Social History   Socioeconomic History  . Marital status: Married    Spouse name: Legrand Como  . Number of children: 2  . Years of education: 36  . Highest education level: Not on file  Occupational History  . Occupation: Pharmacist, hospital    Comment: Warehouse manager  Social Needs  . Financial resource strain: Not on file  . Food insecurity    Worry: Not on file    Inability: Not on file  . Transportation  needs    Medical: Not on file    Non-medical: Not on file  Tobacco Use  . Smoking status: Former Smoker    Years: 10.00    Quit date: 01/26/2009    Years since quitting: 9.6  . Smokeless tobacco: Never Used  Substance and Sexual Activity  . Alcohol use: Yes    Alcohol/week: 2.0 standard drinks    Types: 2 Glasses of wine per week  . Drug use: No  . Sexual activity: Yes    Partners: Male    Birth control/protection: Condom  Lifestyle  . Physical activity    Days per week: Not on file    Minutes per session: Not on file  . Stress: Not on file  Relationships  . Social Herbalist on phone: Not on file    Gets together: Not on file    Attends religious service: Not on file    Active member of club or organization: Not on file    Attends meetings of clubs or organizations: Not on file    Relationship status: Not on file  . Intimate partner violence    Fear of current or ex partner: Not on file    Emotionally abused: Not on file    Physically abused: Not on file    Forced  sexual activity: Not on file  Other Topics Concern  . Not on file  Social History Narrative   No regular exercise.      Outpatient Medications Prior to Visit  Medication Sig Dispense Refill  . escitalopram (LEXAPRO) 10 MG tablet TAKE 1 TABLET BY MOUTH EVERY DAY 90 tablet 1  . predniSONE (DELTASONE) 20 MG tablet Take 1 tablet (20 mg total) by mouth daily with breakfast. 7 tablet 0  . triamcinolone cream (KENALOG) 0.1 % Apply 1 application topically 2 (two) times daily. Don't use more than than 2  Weeks. 45 g 1   No facility-administered medications prior to visit.     No Known Allergies  ROS Review of Systems    Objective:    Physical Exam  BP 118/81   Pulse 71   Temp 98.9 F (37.2 C)   Ht _0  (1.676 m)   Wt 168 lb (76.2 kg)   BMI 27.12 kg/m  Wt Readings from Last 3 Encounters:  09/29/18 168 lb (76.2 kg)  06/16/18 164 lb (74.4 kg)  06/09/18 167 lb (75.8 kg)     There are no preventive care reminders to display for this patient.  There are no preventive care reminders to display for this patient.  Lab Results  Component Value Date   TSH 2.07 09/14/2017   Lab Results  Component Value Date   WBC 5.9 09/14/2017   HGB 14.0 09/14/2017   HCT 41.1 09/14/2017   MCV 90.5 09/14/2017   PLT 308 09/14/2017   Lab Results  Component Value Date   NA 139 09/14/2017   K 4.1 09/14/2017   CO2 27 09/14/2017   GLUCOSE 89 09/14/2017   BUN 17 09/14/2017   CREATININE 0.84 09/14/2017   BILITOT 0.8 09/14/2017   ALKPHOS 47 08/21/2016   AST 17 09/14/2017   ALT 20 09/14/2017   PROT 6.5 09/14/2017   ALBUMIN 4.2 08/21/2016   CALCIUM 9.5 09/14/2017   Lab Results  Component Value Date   CHOL 260 (H) 09/14/2017   Lab Results  Component Value Date   HDL 52 09/14/2017   Lab Results  Component Value Date   LDLCALC  180 (H) 09/14/2017   Lab Results  Component Value Date   TRIG 142 09/14/2017   Lab Results  Component Value Date   CHOLHDL 5.0 (H) 09/14/2017   No  results found for: HGBA1C    Assessment & Plan:   Problem List Items Addressed This Visit      Other   GAD (generalized anxiety disorder) - Primary    Overall she is actually doing well.  But new stressors will be coming up in a few weeks when her husband starts work and she has to work with her children who are doing a learning in addition to teach herself virtually.  We discussed continuing with the Lexapro for now.  I will make sure that she has plenty of refills on file.  Otherwise I will see her back in 6 months if she has any problems she can reach out sooner.  So encouraged her to try taking the medication closer to dinnertime instead of at bedtime to see if it makes a difference with morning sleepiness/sedation.  We also discussed trying to work on having a more consistent sleep schedule to little bit disjointed right now which is probably causing some of the increased fatigue.       Other Visit Diagnoses    Skin lesion       Relevant Orders   Ambulatory referral to Dermatology   Acute stress reaction         Skin lesion-sound suspicious for a actinic keratosis and may be even an early squamous.  She is very fair skinned and high risk for skin cancer.  Recommend dermatology referral.  She also plans on scheduling a physical in the next month or 2.  No orders of the defined types were placed in this encounter.   Follow-up: No follow-ups on file.    Beatrice Lecher, MD

## 2018-09-29 NOTE — Assessment & Plan Note (Signed)
Overall she is actually doing well.  But new stressors will be coming up in a few weeks when her husband starts work and she has to work with her children who are doing a learning in addition to teach herself virtually.  We discussed continuing with the Lexapro for now.  I will make sure that she has plenty of refills on file.  Otherwise I will see her back in 6 months if she has any problems she can reach out sooner.  So encouraged her to try taking the medication closer to dinnertime instead of at bedtime to see if it makes a difference with morning sleepiness/sedation.  We also discussed trying to work on having a more consistent sleep schedule to little bit disjointed right now which is probably causing some of the increased fatigue.

## 2018-09-29 NOTE — Telephone Encounter (Signed)
I called pt and got her scheduled for 10/20/18 for her Physical but she said she will come to our front desk before her appt to get her lab order and If you could leave that at the front desk for her

## 2018-09-30 ENCOUNTER — Other Ambulatory Visit: Payer: Self-pay | Admitting: *Deleted

## 2018-09-30 DIAGNOSIS — Z0000 Encounter for general adult medical examination without abnormal findings: Secondary | ICD-10-CM

## 2018-09-30 NOTE — Telephone Encounter (Signed)
Orders placed up front for pt.Jasmine Gomez, [name], CMA

## 2018-10-13 ENCOUNTER — Telehealth: Payer: Self-pay | Admitting: *Deleted

## 2018-10-13 DIAGNOSIS — N950 Postmenopausal bleeding: Secondary | ICD-10-CM

## 2018-10-13 NOTE — Telephone Encounter (Signed)
Pt called stating that she hasn't had a period in over a year and has been having some spotting and today a full flow.  She has had an [facility] which indicated she was in menopause.  Pelvic U/S ordered and pt to f/u with an appt with Dr Hulan Fray for possible Endo [facility]

## 2018-10-19 ENCOUNTER — Encounter: Payer: Self-pay | Admitting: Family Medicine

## 2018-10-19 ENCOUNTER — Other Ambulatory Visit: Payer: BC Managed Care – PPO

## 2018-10-19 DIAGNOSIS — Z85828 Personal history of other malignant neoplasm of skin: Secondary | ICD-10-CM | POA: Insufficient documentation

## 2018-10-20 ENCOUNTER — Ambulatory Visit (INDEPENDENT_AMBULATORY_CARE_PROVIDER_SITE_OTHER): Payer: BC Managed Care – PPO | Admitting: Family Medicine

## 2018-10-20 ENCOUNTER — Encounter: Payer: Self-pay | Admitting: Family Medicine

## 2018-10-20 ENCOUNTER — Other Ambulatory Visit: Payer: Self-pay

## 2018-10-20 ENCOUNTER — Other Ambulatory Visit: Payer: Self-pay | Admitting: Obstetrics & Gynecology

## 2018-10-20 VITALS — BP 122/76 | HR 68 | Ht 66.0 in | Wt 168.0 lb

## 2018-10-20 DIAGNOSIS — R5383 Other fatigue: Secondary | ICD-10-CM

## 2018-10-20 DIAGNOSIS — Z0000 Encounter for general adult medical examination without abnormal findings: Secondary | ICD-10-CM | POA: Diagnosis not present

## 2018-10-20 DIAGNOSIS — Z23 Encounter for immunization: Secondary | ICD-10-CM | POA: Diagnosis not present

## 2018-10-20 DIAGNOSIS — Z1231 Encounter for screening mammogram for malignant neoplasm of breast: Secondary | ICD-10-CM

## 2018-10-20 NOTE — Progress Notes (Signed)
Subjective:     Jasmine Gomez is a 43 y.o. female and is here for a comprehensive physical exam. The patient reports no problems.  She did go see dermatology and had a biopsy of the lesion on her upper lip.  It was diagnosed as basal cell skin cancer.  Though she says she has not heard back from their office yet.  She says she walks a couple of times a week but knows she needs to get more active.  Is also just felt really tired.  She says by the end of doing a learning all day she just wants to sleep she feels exhausted he is even been taking some occasional naps.  Social History   Socioeconomic History  . Marital status: Married    Spouse name: Legrand Como  . Number of children: 2  . Years of education: 32  . Highest education level: Not on file  Occupational History  . Occupation: Pharmacist, hospital    Comment: Warehouse manager  Social Needs  . Financial resource strain: Not on file  . Food insecurity    Worry: Not on file    Inability: Not on file  . Transportation needs    Medical: Not on file    Non-medical: Not on file  Tobacco Use  . Smoking status: Former Smoker    Years: 10.00    Quit date: 01/26/2009    Years since quitting: 9.7  . Smokeless tobacco: Never Used  Substance and Sexual Activity  . Alcohol use: Yes    Alcohol/week: 2.0 standard drinks    Types: 2 Glasses of wine per week  . Drug use: No  . Sexual activity: Yes    Partners: Male    Birth control/protection: Condom  Lifestyle  . Physical activity    Days per week: Not on file    Minutes per session: Not on file  . Stress: Not on file  Relationships  . Social Herbalist on phone: Not on file    Gets together: Not on file    Attends religious service: Not on file    Active member of club or organization: Not on file    Attends meetings of clubs or organizations: Not on file    Relationship status: Not on file  . Intimate partner violence    Fear of current or ex partner: Not on file     Emotionally abused: Not on file    Physically abused: Not on file    Forced sexual activity: Not on file  Other Topics Concern  . Not on file  Social History Narrative   No regular exercise.     Health Maintenance  Topic Date Due  . INFLUENZA VACCINE  04/26/2019 (Originally 08/27/2018)  . PAP SMEAR-Modifier  03/15/2020  . TETANUS/TDAP  06/02/2023  . HIV Screening  Completed    The following portions of the patient's history were reviewed and updated as appropriate: allergies, current medications, past family history, past medical history, past social history, past surgical history and problem list.  Review of Systems A comprehensive review of systems was negative.   Objective:    BP 122/76   Pulse 68   Ht 5\' 6"  (1.676 m)   Wt 168 lb (76.2 kg)   SpO2 100%   BMI 27.12 kg/m  General appearance: alert and cooperative Head: Normocephalic, without obvious abnormality, atraumatic Eyes: conj cler, EOMI, PEERLA Ears: normal TM's and external ear canals both ears Nose: Nares normal. Septum midline. Mucosa  normal. No drainage or sinus tenderness. Throat: lips, mucosa, and tongue normal; teeth and gums normal Neck: no adenopathy, no carotid bruit, no JVD, supple, symmetrical, trachea midline and thyroid not enlarged, symmetric, no tenderness/mass/nodules Back: symmetric, no curvature. ROM normal. No CVA tenderness. Lungs: clear to auscultation bilaterally Heart: regular rate and rhythm, S1, S2 normal, no murmur, click, rub or gallop Abdomen: soft, non-tender; bowel sounds normal; no masses,  no organomegaly Extremities: extremities normal, atraumatic, no cyanosis or edema Pulses: 2+ and symmetric Skin: Skin color, texture, turgor normal. No rashes or lesions    Assessment:    Healthy female exam.      Plan:     See After Visit Summary for Counseling Recommendations    Keep up a regular exercise program and make sure you are eating a healthy diet Try to eat 4 servings of dairy  a day, or if you are lactose intolerant take a calcium with vitamin D daily.  Your vaccines are up to date.   Fatigue-we will do some additional labs just to rule out anemia thyroid disorder etc.

## 2018-10-20 NOTE — Patient Instructions (Signed)

## 2018-10-21 ENCOUNTER — Ambulatory Visit (INDEPENDENT_AMBULATORY_CARE_PROVIDER_SITE_OTHER): Payer: BC Managed Care – PPO

## 2018-10-21 DIAGNOSIS — N950 Postmenopausal bleeding: Secondary | ICD-10-CM | POA: Diagnosis not present

## 2018-11-10 ENCOUNTER — Ambulatory Visit (INDEPENDENT_AMBULATORY_CARE_PROVIDER_SITE_OTHER): Payer: BC Managed Care – PPO

## 2018-11-10 ENCOUNTER — Other Ambulatory Visit: Payer: Self-pay

## 2018-11-10 DIAGNOSIS — Z1231 Encounter for screening mammogram for malignant neoplasm of breast: Secondary | ICD-10-CM | POA: Diagnosis not present

## 2018-11-17 ENCOUNTER — Encounter: Payer: Self-pay | Admitting: Family Medicine

## 2019-03-24 ENCOUNTER — Other Ambulatory Visit: Payer: Self-pay | Admitting: Family Medicine

## 2019-03-24 ENCOUNTER — Other Ambulatory Visit: Payer: Self-pay

## 2019-03-24 ENCOUNTER — Ambulatory Visit (INDEPENDENT_AMBULATORY_CARE_PROVIDER_SITE_OTHER): Payer: BC Managed Care – PPO | Admitting: Nurse Practitioner

## 2019-03-24 ENCOUNTER — Encounter: Payer: Self-pay | Admitting: Nurse Practitioner

## 2019-03-24 VITALS — BP 116/77 | HR 74 | Temp 97.9°F | Ht 66.0 in | Wt 180.1 lb

## 2019-03-24 DIAGNOSIS — F411 Generalized anxiety disorder: Secondary | ICD-10-CM | POA: Diagnosis not present

## 2019-03-24 DIAGNOSIS — Z713 Dietary counseling and surveillance: Secondary | ICD-10-CM

## 2019-03-24 MED ORDER — PHENTERMINE HCL 15 MG PO CAPS: 15.0000 mg | ORAL_CAPSULE | ORAL | 0 refills | Status: DC

## 2019-03-24 NOTE — Progress Notes (Signed)
Acute Office Visit  Subjective:    Patient ID: Jasmine Gomez, female    DOB: November 08, 1975, 44 y.o.   MRN: [id number]  Chief Complaint  Patient presents with  . Anxiety    HPI Patient is in today to discuss the option of stopping escitalopram.  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  She reports her anxiety levels were extremely high at that time.  She feels that now the situation has improved and she and her family are in a routine.  Additionally she reports an approximate 20 pound weight gain since starting the medication and feels that the 2 may be related.  She is interested trialing off the medication to see how she feels.  She does have significant concerns over the 20 pound weight gain that she has put on since the fall.  She is interested in trying a short trial of phentermine, which has been helpful in the past for helping with weight loss.  Past Medical History:  Diagnosis Date  . Family history of breast cancer 11/16/2011   Mother and sister with  BrCa.  Patient is neg for BrCa gene.     Marland Kitchen Headache(784.0)   . Migraines     Past Surgical History:  Procedure Laterality Date  . NASAL SINUS SURGERY  1989  . TONSILLECTOMY  1988  . WISDOM TOOTH EXTRACTION      Family History  Problem Relation Age of Onset  . Breast cancer Mother 8       again 71  . BRCA 1/2 Mother   . Hyperlipidemia Father   . Breast cancer Sister 30       during pregnancy  . BRCA 1/2 Sister   . Breast cancer Maternal Grandmother 64    Social History   Socioeconomic History  . Marital status: Married    Spouse name: Legrand Como  . Number of children: 2  . Years of education: 27  . Highest education level: Not on file  Occupational History  . Occupation: Pharmacist, hospital    Comment: [employer]  Tobacco Use  . Smoking status: Former Smoker    Years:  10.00    Quit date: 01/26/2009    Years since quitting: 10.1  . Smokeless tobacco: Never Used  Substance and Sexual Activity  . Alcohol use: Yes    Alcohol/week: 2.0 standard drinks    Types: 2 Glasses of wine per week  . Drug use: No  . Sexual activity: Yes    Partners: Male    Birth control/protection: Condom  Other Topics Concern  . Not on file  Social History Narrative   No regular exercise.     Social Determinants of Health   Financial Resource Strain:   . Difficulty of Paying Living Expenses: Not on file  Food Insecurity:   . Worried About Charity fundraiser in the Last Year: Not on file  . Ran Out of Food in the Last Year: Not on file  Transportation Needs:   . Lack of Transportation (Medical): Not on file  . Lack of Transportation (Non-Medical): Not on file  Physical Activity:   . Days of Exercise per Week: Not on file  . Minutes of Exercise per Session: Not on file  Stress:   . Feeling of Stress : Not on file  Social Connections:   . Frequency of Communication with Friends  and Family: Not on file  . Frequency of Social Gatherings with Friends and Family: Not on file  . Attends Religious Services: Not on file  . Active Member of Clubs or Organizations: Not on file  . Attends Archivist Meetings: Not on file  . Marital Status: Not on file  Intimate Partner Violence:   . Fear of Current or Ex-Partner: Not on file  . Emotionally Abused: Not on file  . Physically Abused: Not on file  . Sexually Abused: Not on file    Outpatient Medications Prior to Visit  Medication Sig Dispense Refill  . escitalopram (LEXAPRO) 10 MG tablet TAKE 1 TABLET BY MOUTH EVERY DAY 90 tablet 1   No facility-administered medications prior to visit.    No Known Allergies  Review of Systems  Constitutional: Positive for fatigue and unexpected weight change. Negative for activity change and appetite change.  Respiratory: Negative for chest tightness and shortness of breath.    Cardiovascular: Negative for chest pain and palpitations.  Psychiatric/Behavioral: Negative for agitation, decreased concentration and sleep disturbance. The patient is not nervous/anxious and is not hyperactive.        Objective:    Physical Exam Vitals and nursing note reviewed.  Constitutional:      Appearance: Normal appearance.  Eyes:     Conjunctiva/sclera: Conjunctivae normal.     Pupils: Pupils are equal, round, and reactive to light.  Cardiovascular:     Rate and Rhythm: Normal rate.     Heart sounds: Normal heart sounds.  Pulmonary:     Effort: Pulmonary effort is normal.  Abdominal:     General: Abdomen is flat.  Musculoskeletal:        General: Normal range of motion.     Cervical back: Normal range of motion.  Skin:    General: Skin is warm and dry.     Capillary Refill: Capillary refill takes less than 2 seconds.  Neurological:     General: No focal deficit present.     Mental Status: She is alert and oriented to person, place, and time.  Psychiatric:        Mood and Affect: Mood normal.        Behavior: Behavior normal.        Thought Content: Thought content normal.        Judgment: Judgment normal.     BP 116/77   Pulse 74   Temp 97.9 F (36.6 C) (Oral)   Ht '5\' 6"'  (1.676 m)   Wt 180 lb 1.9 oz (81.7 kg)   SpO2 98%   BMI 29.07 kg/m  Wt Readings from Last 3 Encounters:  03/24/19 180 lb 1.9 oz (81.7 kg)  10/20/18 168 lb (76.2 kg)  09/29/18 168 lb (76.2 kg)    There are no preventive care reminders to display for this patient.  There are no preventive care reminders to display for this patient.   Lab Results  Component Value Date   TSH 2.07 09/14/2017   Lab Results  Component Value Date   WBC 5.9 09/14/2017   HGB 14.0 09/14/2017   HCT 41.1 09/14/2017   MCV 90.5 09/14/2017   PLT 308 09/14/2017   Lab Results  Component Value Date   NA 139 09/14/2017   K 4.1 09/14/2017   CO2 27 09/14/2017   GLUCOSE 89 09/14/2017   BUN 17  09/14/2017   CREATININE 0.84 09/14/2017   BILITOT 0.8 09/14/2017   ALKPHOS 47 08/21/2016   AST 17  09/14/2017   ALT 20 09/14/2017   PROT 6.5 09/14/2017   ALBUMIN 4.2 08/21/2016   CALCIUM 9.5 09/14/2017   Lab Results  Component Value Date   CHOL 260 (H) 09/14/2017   Lab Results  Component Value Date   HDL 52 09/14/2017   Lab Results  Component Value Date   LDLCALC 180 (H) 09/14/2017   Lab Results  Component Value Date   TRIG 142 09/14/2017   Lab Results  Component Value Date   CHOLHDL 5.0 (H) 09/14/2017   No results found for: HGBA1C     Assessment & Plan:   1. GAD (generalized anxiety disorder) Patient reports improved symptoms of general anxiety disorder.  Discussed other options of anxiety treatment that have lower risk of weight gain. At this time the patient would like to take a trial off of the escitalopram to see how she feels without any medications.  The patient was instructed to taper the escitalopram by taking 1 (30m) tablet every other day for the next 7 days and then stop altogether. Patient encouraged to reach out if symptoms worsen.  2. Encounter for weight loss counseling Patient has an approximate 20 pound weight gain since the fall of last year and difficulty losing this excess weight.  She had a successful 15 pound weight loss with short-term phentermine use in the past. Discussed utilization of proper diet and exercise to assist with weight management. Patient educated on risks factors associated with short-term and long-term phentermine use.  Will provide 30-day prescription and follow-up in 4 weeks. - phentermine 15 MG capsule; Take 1 capsule (15 mg total) by mouth every morning.  Dispense: 30 capsule; Refill: 0  Return in about 4 weeks (around 04/21/2019) for weight- virtual OK.   SOrma Render NP

## 2019-03-24 NOTE — Patient Instructions (Addendum)
Start by tapering the dose of escitalopram to taking one 10mg  tablet every other day. After 7 days you may stop taking the medication totally.    Preventing Unhealthy Weight Gain, Adult Staying at a healthy weight is important to your overall health. When fat builds up in your body, you may become overweight or obese. Being overweight or obese increases your risk of developing certain health problems, such as heart disease, diabetes, sleeping problems, joint problems, and some types of cancer. Unhealthy weight gain is often the result of making unhealthy food choices or not getting enough exercise. You can make changes to your lifestyle to prevent obesity and stay as healthy as possible. What nutrition changes can be made?   Eat only as much as your body needs. To do this: ? Pay attention to signs that you are hungry or full. Stop eating as soon as you feel full. ? If you feel hungry, try drinking water first before eating. Drink enough water so your urine is clear or pale yellow. ? Eat smaller portions. Pay attention to portion sizes when eating out. ? Look at serving sizes on food labels. Most foods contain more than one serving per container. ? Eat the recommended number of calories for your gender and activity level. For most active people, a daily total of 2,000 calories is appropriate. If you are trying to lose weight or are not very active, you may need to eat fewer calories. Talk with your health care provider or a diet and nutrition specialist (dietitian) about how many calories you need each day.  Choose healthy foods, such as: ? Fruits and vegetables. At each meal, try to fill at least half of your plate with fruits and vegetables. ? Whole grains, such as whole-wheat bread, brown rice, and quinoa. ? Lean meats, such as chicken or fish. ? Other healthy proteins, such as beans, eggs, or tofu. ? Healthy fats, such as nuts, seeds, fatty fish, and olive oil. ? Low-fat or fat-free dairy  products.  Check food labels, and avoid food and drinks that: ? Are high in calories. ? Have added sugar. ? Are high in sodium. ? Have saturated fats or trans fats.  Cook foods in healthier ways, such as by baking, broiling, or grilling.  Make a meal plan for the week, and shop with a grocery list to help you stay on track with your purchases. Try to avoid going to the grocery store when you are hungry.  When grocery shopping, try to shop around the outside of the store first, where the fresh foods are. Doing this helps you to avoid prepackaged foods, which can be high in sugar, salt (sodium), and fat. What lifestyle changes can be made?   Exercise for 30 or more minutes on 5 or more days each week. Exercising may include brisk walking, yard work, biking, running, swimming, and team sports like basketball and soccer. Ask your health care provider which exercises are safe for you.  Do muscle-strengthening activities, such as lifting weights or using resistance bands, on 2 or more days a week.  Do not use any products that contain nicotine or tobacco, such as cigarettes and e-cigarettes. If you need help quitting, ask your health care provider.  Limit alcohol intake to no more than 1 drink a day for nonpregnant women and 2 drinks a day for men. One drink equals 12 oz of beer, 5 oz of wine, or 1 oz of hard liquor.  Try to get 7-9 hours of  sleep each night. What other changes can be made?  Keep a food and activity journal to keep track of: ? What you ate and how many calories you had. Remember to count the calories in sauces, dressings, and side dishes. ? Whether you were active, and what exercises you did. ? Your calorie, weight, and activity goals.  Check your weight regularly. Track any changes. If you notice you have gained weight, make changes to your diet or activity routine.  Avoid taking weight-loss medicines or supplements. Talk to your health care provider before starting any  new medicine or supplement.  Talk to your health care provider before trying any new diet or exercise plan. Why are these changes important? Eating healthy, staying active, and having healthy habits can help you to prevent obesity. Those changes also:  Help you manage stress and emotions.  Help you connect with friends and family.  Improve your self-esteem.  Improve your sleep.  Prevent long-term health problems. What can happen if changes are not made? Being obese or overweight can cause you to develop joint or bone problems, which can make it hard for you to stay active or do activities you enjoy. Being obese or overweight also puts stress on your heart and lungs and can lead to health problems like diabetes, heart disease, and some cancers. Where to find more information Talk with your health care provider or a dietitian about healthy eating and healthy lifestyle choices. You may also find information from:  [place] Department of Agriculture, MyPlate: FormerBoss.no  American Heart Association: www.heart.org  Centers for Disease Control and Prevention: http://www.wolf.info/ Summary  Staying at a healthy weight is important to your overall health. It helps you to prevent certain diseases and health problems, such as heart disease, diabetes, joint problems, sleep disorders, and some types of cancer.  Being obese or overweight can cause you to develop joint or bone problems, which can make it hard for you to stay active or do activities you enjoy.  You can prevent unhealthy weight gain by eating a healthy diet, exercising regularly, not smoking, limiting alcohol, and getting enough sleep.  Talk with your health care provider or a dietitian for guidance about healthy eating and healthy lifestyle choices. This information is not intended to replace advice given to you by your health care provider. Make sure you discuss any questions you have with your health care provider. Document  Revised: 01/15/2017 Document Reviewed: 02/19/2016 Elsevier Patient Education  2020 [employer].

## 2019-04-20 ENCOUNTER — Ambulatory Visit: Payer: BC Managed Care – PPO | Admitting: Family Medicine

## 2019-06-30 ENCOUNTER — Encounter: Payer: Self-pay | Admitting: Family Medicine

## 2019-06-30 NOTE — Telephone Encounter (Signed)
Pt's last TDAP/Tetanus was on 05/072015.

## 2019-09-07 ENCOUNTER — Telehealth (INDEPENDENT_AMBULATORY_CARE_PROVIDER_SITE_OTHER): Payer: BC Managed Care – PPO | Admitting: Nurse Practitioner

## 2019-09-07 ENCOUNTER — Encounter: Payer: Self-pay | Admitting: Nurse Practitioner

## 2019-09-07 DIAGNOSIS — F320 Major depressive disorder, single episode, mild: Secondary | ICD-10-CM | POA: Diagnosis not present

## 2019-09-07 DIAGNOSIS — F411 Generalized anxiety disorder: Secondary | ICD-10-CM

## 2019-09-07 MED ORDER — BUPROPION HCL ER (XL) 150 MG PO TB24: 150.0000 mg | ORAL_TABLET | Freq: Every day | ORAL | 2 refills | Status: DC

## 2019-09-07 MED ORDER — BUSPIRONE HCL 5 MG PO TABS: 5.0000 mg | ORAL_TABLET | Freq: Two times a day (BID) | ORAL | 2 refills | Status: DC

## 2019-09-07 NOTE — Progress Notes (Signed)
Virtual Video Visit via MyChart Note  I connected with  Jasmine Gomez on 09/07/19 at  9:30 AM EDT by the video enabled telemedicine application for , MyChart, and verified that I am speaking with the correct person using two identifiers.   I introduced myself as a Designer, jewellery with the practice. We discussed the limitations of evaluation and management by telemedicine and the availability of in person appointments. The patient expressed understanding and agreed to proceed.  The patient is: at home I am: in the office  Subjective:    CC:  Chief Complaint  Patient presents with  . Anxiety    PH!-9: 6, somewhat difficult; GAD-7: 18, somewhat difficult    HPI: Jasmine Gomez is a 44 y.o. y/o female presenting via [facility] today for anxiety.  Her PHQ-9 is a 6 today with somewhat difficulty with daily activities Her GAD7 is an 24 today with somewhat difficulty with daily activities.   She reports she has had anxiety symptoms in the past, but has been able to be off of medication for a while now. Recently her husband lost his job at the [employer] and this has exacerbated her anxiety and increased her level of stress significantly. She is also going through menopause, which increases her symptoms. She does feel she has a component of ADHD and is interested in discussing Wellbutrin.   ANXIETY/STRESS Duration:exacerbated Anxious mood: yes  Excessive worrying: yes Irritability: yes  Sweating: attributes to menopause Nausea: no Palpitations:yes Hyperventilation: yes Panic attacks: yes Agoraphobia: no  Obscessions/compulsions: yes Depressed mood: yes  Anhedonia: no Weight changes: yes Insomnia: no   Hypersomnia: no Fatigue/loss of energy: yes Feelings of worthlessness: no Feelings of guilt: no Impaired concentration/indecisiveness: yes Suicidal ideations: no  Crying spells: yes Recent Stressors/Life Changes: yes   Relationship problems: no   Family stress: yes      Financial stress: yes    Job stress: yes - husband recently lost his job   Recent death/loss: no  Medications tried in the past: Lexapro- but this made her feel very tired.  Current Medications: None  Depression screen [facility] 2/9 09/07/2019 03/24/2019 10/20/2018 09/29/2018 06/16/2018  Decreased Interest 0 1 0 0 1  Down, Depressed, Hopeless 1 0 0 0 0  PHQ - 2 Score 1 1 0 0 1  Altered sleeping 0 3 - - -  Tired, decreased energy 2 3 - - -  Change in appetite 0 0 - - -  Feeling bad or failure about yourself  0 0 - - -  Trouble concentrating 3 1 - - -  Moving slowly or fidgety/restless 0 0 - - -  Suicidal thoughts 0 0 - - -  PHQ-9 Score 6 8 - - -  Difficult doing work/chores Somewhat difficult Not difficult at all - - -   GAD 7 : Generalized Anxiety Score 09/07/2019 03/24/2019 09/29/2018 06/16/2018  Nervous, Anxious, on Edge 3 0 0 1  Control/stop worrying 2 0 0 0  Worry too much - different things 3 0 0 0  Trouble relaxing 3 1 0 1  Restless 3 1 0 0  Easily annoyed or irritable 2 0 0 1  Afraid - awful might happen - 0 0 1  Total GAD 7 Score - 2 0 4  Anxiety Difficulty - Not difficult at all Not difficult at all Not difficult at all   Past medical history, Surgical history, Family history not pertinant except as noted below, Social history, Allergies, and medications have been entered into  the medical record, reviewed, and corrections made.   Review of Systems:  See HPI for pertinent positive and negatives  Objective:    General: Speaking clearly in complete sentences without any shortness of breath.   Alert and oriented x3.   Normal judgment.  No apparent acute distress.   Impression and Recommendations:   1. GAD (generalized anxiety disorder) Anxiety disorder exacerbated by recent life events.  Discussed medication options that may be helpful for mood and menopausal symptoms including Effexor and Prozac. Patient would like to try Wellbutrin due to concerns over Effexor withdrawal. She  did not tolerate Lexapro as this made her very sleepy. Discussed that Wellbutrin can make anxiety symptoms worse in the first weeks of treatment, but could help with her symptoms consistent with attention and depressive mood. Recommend that she monitor her anxiety closely as she begins the medication and report any increase in anxiety symptoms.  Will add Buspar to the regimen for at least the first 30 days to see if this will reduce the increased anxiety effects of the Wellbutrin.  Follow-up in 4-6 weeks for assessment of mood and medication.  - buPROPion (WELLBUTRIN XL) 150 MG 24 hr tablet; Take 1 tablet (150 mg total) by mouth daily.  Dispense: 30 tablet; Refill: 2 - busPIRone (BUSPAR) 5 MG tablet; Take 1 tablet (5 mg total) by mouth 2 (two) times daily. May take daily or on an as needed basis for anxiety.  Dispense: 60 tablet; Refill: 2  2. Depression, major, single episode, mild (HCC) Symptoms consistent with depression in the setting of increased anxiety symptoms related to stressors.  Discussed multiple medication options with patient to help with both depression and anxiety. She would like to try something other than Lexapro. Joint decision made to start Wellbutrin with Buspar to help with both anxiety and depression. Discussed that the Buspar may be taken as needed or on a scheduled basis for her anxiety.  Plan to follow-up in 4-6 weeks for mood and medication.  - buPROPion (WELLBUTRIN XL) 150 MG 24 hr tablet; Take 1 tablet (150 mg total) by mouth daily.  Dispense: 30 tablet; Refill: 2 - busPIRone (BUSPAR) 5 MG tablet; Take 1 tablet (5 mg total) by mouth 2 (two) times daily. May take daily or on an as needed basis for anxiety.  Dispense: 60 tablet; Refill: 2    I discussed the assessment and treatment plan with the patient. The patient was provided an opportunity to ask questions and all were answered. The patient agreed with the plan and demonstrated an understanding of the instructions.    The patient was advised to call back or seek an in-person evaluation if the symptoms worsen or if the condition fails to improve as anticipated.  I provided 20 minutes of non-face-to-face interaction with this [facility] visit including intake, same-day documentation, and chart review.   Orma Render, NP

## 2019-09-07 NOTE — Patient Instructions (Signed)
Bupropion extended-release tablets (Depression/Mood Disorders) What is this medicine? BUPROPION (byoo PROE pee on) is used to treat depression. This medicine may be used for other purposes; ask your health care provider or pharmacist if you have questions. COMMON BRAND NAME(S): Aplenzin, Budeprion XL, Forfivo XL, Wellbutrin XL What should I tell my health care provider before I take this medicine? They need to know if you have any of these conditions:  an eating disorder, such as anorexia or bulimia  bipolar disorder or psychosis  diabetes or high blood sugar, treated with medication  glaucoma  head injury or brain tumor  heart disease, previous heart attack, or irregular heart beat  high blood pressure  kidney or liver disease  seizures (convulsions)  suicidal thoughts or a previous suicide attempt  Tourette's syndrome  weight loss  an unusual or allergic reaction to bupropion, other medicines, foods, dyes, or preservatives  breast-feeding  pregnant or trying to become pregnant How should I use this medicine? Take this medicine by mouth with a glass of water. Follow the directions on the prescription label. You can take it with or without food. If it upsets your stomach, take it with food. Do not crush, chew, or cut these tablets. This medicine is taken once daily at the same time each day. Do not take your medicine more often than directed. Do not stop taking this medicine suddenly except upon the advice of your doctor. Stopping this medicine too quickly may cause serious side effects or your condition may worsen. A special MedGuide will be given to you by the pharmacist with each prescription and refill. Be sure to read this information carefully each time. Talk to your pediatrician regarding the use of this medicine in children. Special care may be needed. Overdosage: If you think you have taken too much of this medicine contact a poison control center or emergency room  at once. NOTE: This medicine is only for you. Do not share this medicine with others. What if I miss a dose? If you miss a dose, skip the missed dose and take your next tablet at the regular time. Do not take double or extra doses. What may interact with this medicine? Do not take this medicine with any of the following medications:  linezolid  MAOIs like Azilect, Carbex, Eldepryl, Marplan, Nardil, and Parnate  methylene blue (injected into a vein)  other medicines that contain bupropion like Zyban This medicine may also interact with the following medications:  alcohol  certain medicines for anxiety or sleep  certain medicines for blood pressure like metoprolol, propranolol  certain medicines for depression or psychotic disturbances  certain medicines for HIV or AIDS like efavirenz, lopinavir, nelfinavir, ritonavir  certain medicines for irregular heart beat like propafenone, flecainide  certain medicines for Parkinson's disease like amantadine, levodopa  certain medicines for seizures like carbamazepine, phenytoin, phenobarbital  cimetidine  clopidogrel  cyclophosphamide  digoxin  furazolidone  isoniazid  nicotine  orphenadrine  procarbazine  steroid medicines like prednisone or cortisone  stimulant medicines for attention disorders, weight loss, or to stay awake  tamoxifen  theophylline  thiotepa  ticlopidine  tramadol  warfarin This list may not describe all possible interactions. Give your health care provider a list of all the medicines, herbs, non-prescription drugs, or dietary supplements you use. Also tell them if you smoke, drink alcohol, or use illegal drugs. Some items may interact with your medicine. What should I watch for while using this medicine? Tell your doctor if your symptoms do  not get better or if they get worse. Visit your doctor or healthcare provider for regular checks on your progress. Because it may take several weeks to  see the full effects of this medicine, it is important to continue your treatment as prescribed by your doctor. This medicine may cause serious skin reactions. They can happen weeks to months after starting the medicine. Contact your healthcare provider right away if you notice fevers or flu-like symptoms with a rash. The rash may be red or purple and then turn into blisters or peeling of the skin. Or, you might notice a red rash with swelling of the face, lips or lymph nodes in your neck or under your arms. Patients and their families should watch out for new or worsening thoughts of suicide or depression. Also watch out for sudden changes in feelings such as feeling anxious, agitated, panicky, irritable, hostile, aggressive, impulsive, severely restless, overly excited and hyperactive, or not being able to sleep. If this happens, especially at the beginning of treatment or after a change in dose, call your healthcare provider. Avoid alcoholic drinks while taking this medicine. Drinking large amounts of alcoholic beverages, using sleeping or anxiety medicines, or quickly stopping the use of these agents while taking this medicine may increase your risk for a seizure. Do not drive or use heavy machinery until you know how this medicine affects you. This medicine can impair your ability to perform these tasks. Do not take this medicine close to bedtime. It may prevent you from sleeping. Your mouth may get dry. Chewing sugarless gum or sucking hard candy, and drinking plenty of water may help. Contact your doctor if the problem does not go away or is severe. The tablet shell for some brands of this medicine does not dissolve. This is normal. The tablet shell may appear whole in the stool. This is not a cause for concern. What side effects may I notice from receiving this medicine? Side effects that you should report to your doctor or health care professional as soon as possible:  allergic reactions like  skin rash, itching or hives, swelling of the face, lips, or tongue  breathing problems  changes in vision  confusion  elevated mood, decreased need for sleep, racing thoughts, impulsive behavior  fast or irregular heartbeat  hallucinations, loss of contact with reality  increased blood pressure  rash, fever, and swollen lymph nodes  redness, blistering, peeling or loosening of the skin, including inside the mouth  seizures  suicidal thoughts or other mood changes  unusually weak or tired  vomiting Side effects that usually do not require medical attention (report to your doctor or health care professional if they continue or are bothersome):  constipation  headache  loss of appetite  nausea  tremors  weight loss This list may not describe all possible side effects. Call your doctor for medical advice about side effects. You may report side effects to FDA at 1-800-FDA-1088. Where should I keep my medicine? Keep out of the reach of children. Store at room temperature between 15 and 30 degrees C (59 and 86 degrees F). Throw away any unused medicine after the expiration date. NOTE: This sheet is a summary. It may not cover all possible information. If you have questions about this medicine, talk to your doctor, pharmacist, or health care provider.  2020 Elsevier/Gold Standard (2018-04-07 13:45:31)  Buspirone tablets What is this medicine? BUSPIRONE (byoo SPYE rone) is used to treat anxiety disorders. This medicine may be used for  other purposes; ask your health care provider or pharmacist if you have questions. COMMON BRAND NAME(S): BuSpar What should I tell my health care provider before I take this medicine? They need to know if you have any of these conditions:  kidney or liver disease  an unusual or allergic reaction to buspirone, other medicines, foods, dyes, or preservatives  pregnant or trying to get pregnant  breast-feeding How should I use this  medicine? Take this medicine by mouth with a glass of water. Follow the directions on the prescription label. You may take this medicine with or without food. To ensure that this medicine always works the same way for you, you should take it either always with or always without food. Take your doses at regular intervals. Do not take your medicine more often than directed. Do not stop taking except on the advice of your doctor or health care professional. Talk to your pediatrician regarding the use of this medicine in children. Special care may be needed. Overdosage: If you think you have taken too much of this medicine contact a poison control center or emergency room at once. NOTE: This medicine is only for you. Do not share this medicine with others. What if I miss a dose? If you miss a dose, take it as soon as you can. If it is almost time for your next dose, take only that dose. Do not take double or extra doses. What may interact with this medicine? Do not take this medicine with any of the following medications:  linezolid  MAOIs like Carbex, Eldepryl, Marplan, Nardil, and Parnate  methylene blue  procarbazine This medicine may also interact with the following medications:  diazepam  digoxin  diltiazem  erythromycin  grapefruit juice  haloperidol  medicines for mental depression or mood problems  medicines for seizures like carbamazepine, phenobarbital and phenytoin  nefazodone  other medications for anxiety  rifampin  ritonavir  some antifungal medicines like itraconazole, ketoconazole, and voriconazole  verapamil  warfarin This list may not describe all possible interactions. Give your health care provider a list of all the medicines, herbs, non-prescription drugs, or dietary supplements you use. Also tell them if you smoke, drink alcohol, or use illegal drugs. Some items may interact with your medicine. What should I watch for while using this  medicine? Visit your doctor or health care professional for regular checks on your progress. It may take 1 to 2 weeks before your anxiety gets better. You may get drowsy or dizzy. Do not drive, use machinery, or do anything that needs mental alertness until you know how this drug affects you. Do not stand or sit up quickly, especially if you are an older patient. This reduces the risk of dizzy or fainting spells. Alcohol can make you more drowsy and dizzy. Avoid alcoholic drinks. What side effects may I notice from receiving this medicine? Side effects that you should report to your doctor or health care professional as soon as possible:  blurred vision or other vision changes  chest pain  confusion  difficulty breathing  feelings of hostility or anger  muscle aches and pains  numbness or tingling in hands or feet  ringing in the ears  skin rash and itching  vomiting  weakness Side effects that usually do not require medical attention (report to your doctor or health care professional if they continue or are bothersome):  disturbed dreams, nightmares  headache  nausea  restlessness or nervousness  sore throat and nasal congestion  stomach upset This list may not describe all possible side effects. Call your doctor for medical advice about side effects. You may report side effects to FDA at 1-800-FDA-1088. Where should I keep my medicine? Keep out of the reach of children. Store at room temperature below 30 degrees C (86 degrees F). Protect from light. Keep container tightly closed. Throw away any unused medicine after the expiration date. NOTE: This sheet is a summary. It may not cover all possible information. If you have questions about this medicine, talk to your doctor, pharmacist, or health care provider.  2020 Elsevier/Gold Standard (2009-08-22 18:06:11)

## 2019-09-29 ENCOUNTER — Other Ambulatory Visit: Payer: Self-pay | Admitting: Nurse Practitioner

## 2019-09-29 DIAGNOSIS — F411 Generalized anxiety disorder: Secondary | ICD-10-CM

## 2019-09-29 DIAGNOSIS — F320 Major depressive disorder, single episode, mild: Secondary | ICD-10-CM

## 2019-10-16 ENCOUNTER — Ambulatory Visit (INDEPENDENT_AMBULATORY_CARE_PROVIDER_SITE_OTHER): Payer: BC Managed Care – PPO | Admitting: Obstetrics & Gynecology

## 2019-10-16 ENCOUNTER — Other Ambulatory Visit: Payer: Self-pay

## 2019-10-16 ENCOUNTER — Encounter: Payer: Self-pay | Admitting: Obstetrics & Gynecology

## 2019-10-16 VITALS — BP 131/85 | HR 75 | Resp 16 | Ht 64.0 in | Wt 174.0 lb

## 2019-10-16 DIAGNOSIS — Z1231 Encounter for screening mammogram for malignant neoplasm of breast: Secondary | ICD-10-CM

## 2019-10-16 DIAGNOSIS — Z01419 Encounter for gynecological examination (general) (routine) without abnormal findings: Secondary | ICD-10-CM | POA: Diagnosis not present

## 2019-10-16 DIAGNOSIS — R5383 Other fatigue: Secondary | ICD-10-CM | POA: Diagnosis not present

## 2019-10-16 DIAGNOSIS — N950 Postmenopausal bleeding: Secondary | ICD-10-CM | POA: Diagnosis not present

## 2019-10-16 NOTE — Progress Notes (Signed)
Subjective:     Jasmine Gomez is a 44 y.o. female here for a routine exam.  Current complaints: pMB.  Pt had PMB last year (early menopause).  TVUS showed 3 mm lining.  Pt went a whole year again then had spotting in August.  No Pain.  Pt denies hot flahses and flushes.  She reports weight gain and unsure if due to inactivity during covid.    Gynecologic History Patient's last menstrual period was 08/27/2019. Contraception: vasectomy Last Pap: 2019. Results were: normal Last mammogram: 2020. Results were: normal  Obstetric History OB History  Gravida Para Term Preterm AB Living  3 3 3     3   SAB TAB Ectopic Multiple Live Births          3    # Outcome Date GA Lbr Len/2nd Weight Sex Delivery Anes PTL Lv  3 Term 08/14/13 [redacted]w[redacted]d 18:26 / 00:22 8 lb 8.9 oz (3.881 kg) M Vag-Spont EPI  LIV  2 Term 08/26/07 [redacted]w[redacted]d  8 lb 6 oz (3.799 kg) M Vag-Spont EPI N LIV  1 Term 09/18/05 [redacted]w[redacted]d  8 lb 2 oz (3.685 kg) M Vag-Spont EPI N LIV     The following portions of the patient's history were reviewed and updated as appropriate: allergies, current medications, past family history, past medical history, past social history, past surgical history and problem list.  Review of Systems Pertinent items noted in HPI and remainder of comprehensive ROS otherwise negative.    Objective:      Vitals:   10/16/19 1553  BP: 131/85  Pulse: 75  Resp: 16  Weight: 174 lb (78.9 kg)  Height: 5\' 4"  (1.626 m)   Vitals:  WNL General appearance: alert, cooperative and no distress  HEENT: Normocephalic, without obvious abnormality, atraumatic Eyes: negative Throat: lips, mucosa, and tongue normal; teeth and gums normal  Respiratory: Clear to auscultation bilaterally  CV: Regular rate and rhythm  Breasts:  Normal appearance, no masses or tenderness, no nipple retraction or dimpling  GI: Soft, non-tender; bowel sounds normal; no masses,  no organomegaly  GU: External Genitalia:  Tanner V, no lesion Urethra:  No  prolapse   Vagina: Pink, normal rugae, no blood or discharge  Cervix: No CMT, no lesion  Uterus:  Normal size and contour, non tender  Adnexa: Normal, no masses, non tender  Musculoskeletal: No edema, redness or tenderness in the calves or thighs  Skin: No lesions or rash  Lymphatic: Axillary adenopathy: none     Psychiatric: Normal mood and behavior        Assessment:    Healthy female exam.   PMB   Plan:    Pap due 2022 Yearly mammograms Recheck FSH Pt was supposed to get labs with Dr. Joellyn Quails and was unable to get them drawn.  Will reorder and draw.  Will need to come back for cholesterol.   TSH and others. Rpt pelvic US.  If bleeding occurs again will get endometrial biopsy.

## 2019-10-17 LAB — COMPREHENSIVE METABOLIC PANEL
AG Ratio: 2.3 (calc) (ref 1.0–2.5)
ALT: 31 U/L — ABNORMAL HIGH (ref 6–29)
AST: 20 U/L (ref 10–30)
Albumin: 4.4 g/dL (ref 3.6–5.1)
Alkaline phosphatase (APISO): 52 U/L (ref 31–125)
BUN: 9 mg/dL (ref 7–25)
CO2: 27 mmol/L (ref 20–32)
Calcium: 9.3 mg/dL (ref 8.6–10.2)
Chloride: 104 mmol/L (ref 98–110)
Creat: 0.71 mg/dL (ref 0.50–1.10)
Globulin: 1.9 g/dL (calc) (ref 1.9–3.7)
Glucose, Bld: 118 mg/dL (ref 65–139)
Potassium: 3.6 mmol/L (ref 3.5–5.3)
Sodium: 139 mmol/L (ref 135–146)
Total Bilirubin: 0.4 mg/dL (ref 0.2–1.2)
Total Protein: 6.3 g/dL (ref 6.1–8.1)

## 2019-10-17 LAB — FERRITIN: Ferritin: 57 ng/mL (ref 16–232)

## 2019-10-17 LAB — VITAMIN B12: Vitamin B-12: 486 pg/mL (ref 200–1100)

## 2019-10-17 LAB — TSH: TSH: 2.77 mIU/L

## 2019-10-17 LAB — FOLATE: Folate: 14 ng/mL

## 2019-10-17 LAB — FOLLICLE STIMULATING HORMONE: FSH: 15.1 m[IU]/mL

## 2019-10-19 ENCOUNTER — Other Ambulatory Visit: Payer: Self-pay

## 2019-10-19 ENCOUNTER — Ambulatory Visit (INDEPENDENT_AMBULATORY_CARE_PROVIDER_SITE_OTHER): Payer: BC Managed Care – PPO

## 2019-10-19 ENCOUNTER — Encounter: Payer: Self-pay | Admitting: Family Medicine

## 2019-10-19 ENCOUNTER — Telehealth: Payer: Self-pay | Admitting: Family Medicine

## 2019-10-19 DIAGNOSIS — Z01419 Encounter for gynecological examination (general) (routine) without abnormal findings: Secondary | ICD-10-CM | POA: Diagnosis not present

## 2019-10-19 DIAGNOSIS — Z0000 Encounter for general adult medical examination without abnormal findings: Secondary | ICD-10-CM

## 2019-10-19 DIAGNOSIS — E782 Mixed hyperlipidemia: Secondary | ICD-10-CM

## 2019-10-19 NOTE — Telephone Encounter (Signed)
OK just order lipid and CBC

## 2019-10-19 NOTE — Telephone Encounter (Signed)
Patient would like labs for her physical. Jasmine Gomez that she needs the ones for fasting only.

## 2019-10-19 NOTE — Telephone Encounter (Signed)
Pt completed labs for GYN provider on 10/16/19 - CMP, Ferritin, TSH, Folate, B12 and FSH. Pls advise, thanks.

## 2019-10-20 NOTE — Telephone Encounter (Signed)
Task completed. Pt has been updated and will complete her lab order one week before her scheduled appointment. No other inquiries during the call.

## 2019-10-20 NOTE — Addendum Note (Signed)
Addended by: Mertha Finders on: 10/20/2019 03:53 PM   Modules accepted: Orders

## 2019-10-30 ENCOUNTER — Encounter: Payer: Self-pay | Admitting: Obstetrics & Gynecology

## 2019-10-30 ENCOUNTER — Other Ambulatory Visit: Payer: Self-pay

## 2019-10-30 ENCOUNTER — Ambulatory Visit (INDEPENDENT_AMBULATORY_CARE_PROVIDER_SITE_OTHER): Payer: BC Managed Care – PPO | Admitting: Obstetrics & Gynecology

## 2019-10-30 ENCOUNTER — Other Ambulatory Visit: Payer: Self-pay | Admitting: Obstetrics & Gynecology

## 2019-10-30 VITALS — BP 131/88 | HR 73 | Resp 16 | Ht 64.0 in | Wt 174.0 lb

## 2019-10-30 DIAGNOSIS — Z01812 Encounter for preprocedural laboratory examination: Secondary | ICD-10-CM

## 2019-10-30 DIAGNOSIS — Z8481 Family history of carrier of genetic disease: Secondary | ICD-10-CM

## 2019-10-30 DIAGNOSIS — N950 Postmenopausal bleeding: Secondary | ICD-10-CM | POA: Diagnosis not present

## 2019-10-30 DIAGNOSIS — N83291 Other ovarian cyst, right side: Secondary | ICD-10-CM

## 2019-10-30 LAB — POCT URINE PREGNANCY: Preg Test, Ur: NEGATIVE

## 2019-10-30 NOTE — Progress Notes (Signed)
Subjective:    Patient ID: Jasmine Gomez, female    DOB: [birth year], 44 y.o.   MRN: [id number]  HPI  Pt presents for endometrial biopsy.  Pt thought she was menopausal due to elevated FSH and amenorrhea for over a year.  She recently began having bleeding.  Recent FSH is normal range (15.1).  US FINDINGS: Uterus  Measurements: 9.3 x 4.7 x 6.5 cm = volume: 148 mL. A 1.6 cm intramural fibroid is again seen in the posterior fundal region. No other fibroids identified.  Endometrium  Thickness: 10 mm.  No focal abnormality visualized.  Right ovary  Measurements: 1.7 x 3.5 x 3.6 cm = volume: 11 mL. 2.4 cm simple cyst, without significant change compared to prior exam.  Left ovary  Measurements: 1.7 x 1.4 x 2.0 cm = volume: 2 mL. Normal appearance/no adnexal mass.  Other findings  No abnormal free fluid.  IMPRESSION: Endometrial thickness measures 10 mm. In the setting of post-menopausal bleeding, endometrial sampling is indicated to exclude carcinoma. If results are benign, sonohysterogram should be considered for focal lesion work-up. (Ref: Radiological Reasoning: Algorithmic Workup of Abnormal Vaginal Bleeding with Endovaginal Sonography and Sonohysterography. AJR 2008; 732:K02-54).  1.6 cm intramural fibroid in the posterior fundal region.  2.4 cm simple right ovarian cyst, without significant change compared to prior exam. No followup imaging recommended. Note: This recommendation does not apply to premenarchal patients or to those with increased risk (genetic, family history, elevated tumor markers or other high-risk factors) of ovarian cancer. Reference: Radiology Review of Systems     Objective:   Physical Exam Vitals reviewed.  Constitutional:      General: She is not in acute distress.    Appearance: She is well-developed.  HENT:     Head: Normocephalic and atraumatic.  Eyes:     Conjunctiva/sclera: Conjunctivae normal.    Cardiovascular:     Rate and Rhythm: Normal rate.  Pulmonary:     Effort: Pulmonary effort is normal.  Genitourinary:    Comments: Small amount blood in valut.   Cervix points toward pateint right.  Skin:    General: Skin is warm and dry.  Neurological:     Mental Status: She is alert and oriented to person, place, and time.   ENDOMETRIAL BIOPSY     The indications for endometrial biopsy were reviewed.   Risks of the biopsy including cramping, bleeding, infection, uterine perforation, inadequate specimen and need for additional procedures  were discussed. The patient states she understands and agrees to undergo procedure today. Consent was signed. Time out was performed. Urine HCG was negative. A sterile speculum was placed in the patient's vagina and the cervix was prepped with Betadine. A single-toothed tenaculum was placed on the anterior lip of the cervix to stabilize it. The 3 mm pipelle was introduced into the endometrial cavity without difficulty to a depth of 8 cm, and a moderate amount of tissue was obtained and sent to pathology. The instruments were removed from the patient's vagina. Minimal bleeding from the cervix was noted. The patient tolerated the procedure well. Routine post-procedure instructions were given to the patient. The patient will follow up to review the results and for further management.       Assessment & Plan:  44 yo female with PMB with now nml FSH  1.  Endometrial biopsy 2.  Pt has BRCA in sister and mom.  She is BRCA neg from 2007.  We discussed there were other genes they test for  relation to breast cancer.  Pt requests genetics referral. °3.  Simple cyst in right ovary. ° °20 mins spent with patient discussing results, US findings, family, genetic history and documentation.  Endometrial biopsy also done today.   ° °

## 2019-11-09 ENCOUNTER — Encounter: Payer: Self-pay | Admitting: *Deleted

## 2019-11-09 ENCOUNTER — Encounter: Payer: Self-pay | Admitting: Family Medicine

## 2019-11-09 ENCOUNTER — Other Ambulatory Visit: Payer: Self-pay

## 2019-11-09 ENCOUNTER — Ambulatory Visit (INDEPENDENT_AMBULATORY_CARE_PROVIDER_SITE_OTHER): Payer: BC Managed Care – PPO | Admitting: Family Medicine

## 2019-11-09 VITALS — BP 122/78 | HR 75 | Ht 64.0 in | Wt 169.0 lb

## 2019-11-09 DIAGNOSIS — R748 Abnormal levels of other serum enzymes: Secondary | ICD-10-CM | POA: Diagnosis not present

## 2019-11-09 DIAGNOSIS — Z0000 Encounter for general adult medical examination without abnormal findings: Secondary | ICD-10-CM | POA: Diagnosis not present

## 2019-11-09 DIAGNOSIS — Z23 Encounter for immunization: Secondary | ICD-10-CM | POA: Diagnosis not present

## 2019-11-09 DIAGNOSIS — F439 Reaction to severe stress, unspecified: Secondary | ICD-10-CM

## 2019-11-09 NOTE — Progress Notes (Signed)
Subjective:     Jasmine Gomez is a 44 y.o. female and is here for a comprehensive physical exam. The patient reports no problems.  She is on Wellbutrin and doing well.  She had 1 episode 1 evening where her blood pressure shot up but feels like it settled down since then she wants to continue to stick with the Wellbutrin.  She is just using the BuSpar as needed.  Social History   Socioeconomic History  . Marital status: Married    Spouse name: Legrand Como  . Number of children: 2  . Years of education: 18  . Highest education level: Not on file  Occupational History  . Occupation: Pharmacist, hospital    Comment: [employer]  Tobacco Use  . Smoking status: Former Smoker    Years: 10.00    Quit date: 01/26/2009    Years since quitting: 10.7  . Smokeless tobacco: Never Used  Vaping Use  . Vaping Use: Never used  Substance and Sexual Activity  . Alcohol use: Yes    Alcohol/week: 2.0 standard drinks    Types: 2 Glasses of wine per week  . Drug use: No  . Sexual activity: Yes    Partners: Male    Comment: Vasectomy  Other Topics Concern  . Not on file  Social History Narrative   No regular exercise.     Social Determinants of Health   Financial Resource Strain:   . Difficulty of Paying Living Expenses: Not on file  Food Insecurity:   . Worried About Charity fundraiser in the Last Year: Not on file  . Ran Out of Food in the Last Year: Not on file  Transportation Needs:   . Lack of Transportation (Medical): Not on file  . Lack of Transportation (Non-Medical): Not on file  Physical Activity:   . Days of Exercise per Week: Not on file  . Minutes of Exercise per Session: Not on file  Stress:   . Feeling of Stress : Not on file  Social Connections:   . Frequency of Communication with Friends and Family: Not on file  . Frequency of Social Gatherings with Friends and Family: Not on file  . Attends Religious Services: Not on file  . Active Member of Clubs or Organizations: Not on  file  . Attends Archivist Meetings: Not on file  . Marital Status: Not on file  Intimate Partner Violence:   . Fear of Current or Ex-Partner: Not on file  . Emotionally Abused: Not on file  . Physically Abused: Not on file  . Sexually Abused: Not on file   Health Maintenance  Topic Date Due  . Hepatitis C Screening  09/06/2020 (Originally 1975/11/21)  . PAP SMEAR-Modifier  03/15/2020  . TETANUS/TDAP  06/02/2023  . INFLUENZA VACCINE  Completed  . COVID-19 Vaccine  Completed  . HIV Screening  Completed    The following portions of the patient's history were reviewed and updated as appropriate: allergies, current medications, past family history, past medical history, past social history, past surgical history and problem list.  Review of Systems A comprehensive review of systems was negative.   Objective:    BP 122/78   Pulse 75   Ht 5\' 4"  (1.626 m)   Wt 169 lb (76.7 kg)   SpO2 100%   BMI 29.01 kg/m  General appearance: alert, cooperative and appears stated age Head: Normocephalic, without obvious abnormality, atraumatic Eyes: conj clear, EOMi, PEERLA Ears: normal TM's and external ear canals  both ears Nose: Nares normal. Septum midline. Mucosa normal. No drainage or sinus tenderness. Throat: lips, mucosa, and tongue normal; teeth and gums normal Neck: no adenopathy, no carotid bruit, no JVD, supple, symmetrical, trachea midline and thyroid not enlarged, symmetric, no tenderness/mass/nodules Back: symmetric, no curvature. ROM normal. No CVA tenderness. Lungs: clear to auscultation bilaterally Heart: regular rate and rhythm, S1, S2 normal, no murmur, click, rub or gallop Abdomen: soft, non-tender; bowel sounds normal; no masses,  no organomegaly Extremities: extremities normal, atraumatic, no cyanosis or edema Pulses: 2+ and symmetric Skin: Skin color, texture, turgor normal. No rashes or lesions Lymph nodes: Cervical, supraclavicular, and axillary nodes  normal. Neurologic: Alert and oriented X 3, normal strength and tone. Normal symmetric reflexes. Normal coordination and gait    Assessment:    Healthy female exam.      Plan:     See After Visit Summary for Counseling Recommendations   Keep up a regular exercise program and make sure you are eating a healthy diet Try to eat 4 servings of dairy a day, or if you are lactose intolerant take a calcium with vitamin D daily.  Your vaccines are up to date.  Has had some lab recently with OB/GYN but will get additional labs today.  She did have a mildly elevated liver enzymes we will recheck that as well.

## 2019-11-09 NOTE — Patient Instructions (Signed)
Health Maintenance, Female Adopting a healthy lifestyle and getting preventive care are important in promoting health and wellness. Ask your health care provider about:  The right schedule for you to have regular tests and exams.  Things you can do on your own to prevent diseases and keep yourself healthy. What should I know about diet, weight, and exercise? Eat a healthy diet   Eat a diet that includes plenty of vegetables, fruits, low-fat dairy products, and lean protein.  Do not eat a lot of foods that are high in solid fats, added sugars, or sodium. Maintain a healthy weight Body mass index (BMI) is used to identify weight problems. It estimates body fat based on height and weight. Your health care provider can help determine your BMI and help you achieve or maintain a healthy weight. Get regular exercise Get regular exercise. This is one of the most important things you can do for your health. Most adults should:  Exercise for at least 150 minutes each week. The exercise should increase your heart rate and make you sweat (moderate-intensity exercise).  Do strengthening exercises at least twice a week. This is in addition to the moderate-intensity exercise.  Spend less time sitting. Even light physical activity can be beneficial. Watch cholesterol and blood lipids Have your blood tested for lipids and cholesterol at 44 years of age, then have this test every 5 years. Have your cholesterol levels checked more often if:  Your lipid or cholesterol levels are high.  You are older than 44 years of age.  You are at high risk for heart disease. What should I know about cancer screening? Depending on your health history and family history, you may need to have cancer screening at various ages. This may include screening for:  Breast cancer.  Cervical cancer.  Colorectal cancer.  Skin cancer.  Lung cancer. What should I know about heart disease, diabetes, and high blood  pressure? Blood pressure and heart disease  High blood pressure causes heart disease and increases the risk of stroke. This is more likely to develop in people who have high blood pressure readings, are of African descent, or are overweight.  Have your blood pressure checked: ? Every 3-5 years if you are 18-39 years of age. ? Every year if you are 40 years old or older. Diabetes Have regular diabetes screenings. This checks your fasting blood sugar level. Have the screening done:  Once every three years after age 40 if you are at a normal weight and have a low risk for diabetes.  More often and at a younger age if you are overweight or have a high risk for diabetes. What should I know about preventing infection? Hepatitis B If you have a higher risk for hepatitis B, you should be screened for this virus. Talk with your health care provider to find out if you are at risk for hepatitis B infection. Hepatitis C Testing is recommended for:  Everyone born from 1945 through 1965.  Anyone with known risk factors for hepatitis C. Sexually transmitted infections (STIs)  Get screened for STIs, including gonorrhea and chlamydia, if: ? You are sexually active and are younger than 44 years of age. ? You are older than 44 years of age and your health care provider tells you that you are at risk for this type of infection. ? Your sexual activity has changed since you were last screened, and you are at increased risk for chlamydia or gonorrhea. Ask your health care provider if   you are at risk.  Ask your health care provider about whether you are at high risk for HIV. Your health care provider may recommend a prescription medicine to help prevent HIV infection. If you choose to take medicine to prevent HIV, you should first get tested for HIV. You should then be tested every 3 months for as long as you are taking the medicine. Pregnancy  If you are about to stop having your period (premenopausal) and  you may become pregnant, seek counseling before you get pregnant.  Take 400 to 800 micrograms (mcg) of folic acid every day if you become pregnant.  Ask for birth control (contraception) if you want to prevent pregnancy. Osteoporosis and menopause Osteoporosis is a disease in which the bones lose minerals and strength with aging. This can result in bone fractures. If you are 65 years old or older, or if you are at risk for osteoporosis and fractures, ask your health care provider if you should:  Be screened for bone loss.  Take a calcium or vitamin D supplement to lower your risk of fractures.  Be given hormone replacement therapy (HRT) to treat symptoms of menopause. Follow these instructions at home: Lifestyle  Do not use any products that contain nicotine or tobacco, such as cigarettes, e-cigarettes, and chewing tobacco. If you need help quitting, ask your health care provider.  Do not use street drugs.  Do not share needles.  Ask your health care provider for help if you need support or information about quitting drugs. Alcohol use  Do not drink alcohol if: ? Your health care provider tells you not to drink. ? You are pregnant, may be pregnant, or are planning to become pregnant.  If you drink alcohol: ? Limit how much you use to 0-1 drink a day. ? Limit intake if you are breastfeeding.  Be aware of how much alcohol is in your drink. In the [place] one drink equals one 12 oz bottle of beer (355 mL), one 5 oz glass of wine (148 mL), or one 1 oz glass of hard liquor (44 mL). General instructions  Schedule regular health, dental, and eye exams.  Stay current with your vaccines.  Tell your health care provider if: ? You often feel depressed. ? You have ever been abused or do not feel safe at home. Summary  Adopting a healthy lifestyle and getting preventive care are important in promoting health and wellness.  Follow your health care provider's instructions about healthy  diet, exercising, and getting tested or screened for diseases.  Follow your health care provider's instructions on monitoring your cholesterol and blood pressure. This information is not intended to replace advice given to you by your health care provider. Make sure you discuss any questions you have with your health care provider. Document Revised: 01/05/2018 Document Reviewed: 01/05/2018 Elsevier Patient Education  2020 Elsevier Inc.  

## 2019-11-09 NOTE — Assessment & Plan Note (Signed)
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

## 2019-11-15 ENCOUNTER — Ambulatory Visit (INDEPENDENT_AMBULATORY_CARE_PROVIDER_SITE_OTHER): Payer: BC Managed Care – PPO

## 2019-11-15 ENCOUNTER — Other Ambulatory Visit: Payer: Self-pay

## 2019-11-15 DIAGNOSIS — Z1231 Encounter for screening mammogram for malignant neoplasm of breast: Secondary | ICD-10-CM

## 2019-11-20 LAB — HEPATIC FUNCTION PANEL
AG Ratio: 1.9 (calc) (ref 1.0–2.5)
ALT: 23 U/L (ref 6–29)
AST: 18 U/L (ref 10–30)
Albumin: 4.4 g/dL (ref 3.6–5.1)
Alkaline phosphatase (APISO): 60 U/L (ref 31–125)
Bilirubin, Direct: 0.1 mg/dL (ref 0.0–0.2)
Globulin: 2.3 g/dL (calc) (ref 1.9–3.7)
Indirect Bilirubin: 0.4 mg/dL (calc) (ref 0.2–1.2)
Total Bilirubin: 0.5 mg/dL (ref 0.2–1.2)
Total Protein: 6.7 g/dL (ref 6.1–8.1)

## 2019-11-20 LAB — CBC
HCT: 39.8 % (ref 35.0–45.0)
Hemoglobin: 14.1 g/dL (ref 11.7–15.5)
MCH: 32.9 pg (ref 27.0–33.0)
MCHC: 35.4 g/dL (ref 32.0–36.0)
MCV: 93 fL (ref 80.0–100.0)
MPV: 9.7 fL (ref 7.5–12.5)
Platelets: 306 10*3/uL (ref 140–400)
RBC: 4.28 10*6/uL (ref 3.80–5.10)
RDW: 12 % (ref 11.0–15.0)
WBC: 5.8 10*3/uL (ref 3.8–10.8)

## 2019-11-20 LAB — LIPID PANEL
Cholesterol: 252 mg/dL — ABNORMAL HIGH (ref <200)
HDL: 40 mg/dL — ABNORMAL LOW (ref >=50)
LDL Cholesterol (Calc): 174 mg/dL (calc) — ABNORMAL HIGH
Non-HDL Cholesterol (Calc): 212 mg/dL (calc) — ABNORMAL HIGH (ref <130)
Total CHOL/HDL Ratio: 6.3 (calc) — ABNORMAL HIGH (ref <5.0)
Triglycerides: 223 mg/dL — ABNORMAL HIGH (ref <150)

## 2019-11-23 ENCOUNTER — Inpatient Hospital Stay: Payer: BC Managed Care – PPO | Admitting: Genetic Counselor

## 2019-11-23 ENCOUNTER — Inpatient Hospital Stay: Payer: BC Managed Care – PPO

## 2019-11-27 ENCOUNTER — Other Ambulatory Visit: Payer: Self-pay | Admitting: *Deleted

## 2019-11-27 DIAGNOSIS — N950 Postmenopausal bleeding: Secondary | ICD-10-CM

## 2019-11-27 NOTE — Progress Notes (Signed)
Pt called to schedule her Sonohysterogram.  Ordered placed per Dr Gala Romney

## 2019-12-15 ENCOUNTER — Encounter: Payer: Self-pay | Admitting: Genetic Counselor

## 2019-12-15 ENCOUNTER — Telehealth: Payer: Self-pay | Admitting: Genetic Counselor

## 2019-12-15 DIAGNOSIS — Z1379 Encounter for other screening for genetic and chromosomal anomalies: Secondary | ICD-10-CM | POA: Insufficient documentation

## 2019-12-15 NOTE — Telephone Encounter (Signed)
Jasmine Gomez provided her genetic testing report from 2006, which stated she was negative for the familial BRCA1 mutation detected in her mother and sister.  She stated that the only cancer in her paternal family was sinus cancer in her paternal grandfather.  Given her negative test result, she wished to cancel her upcoming genetic counseling appointment on 12/18/2019. We briefly discussed that given her negative result, her chance of breast cancer is about the same as the general population and she should continue cancer screenings recommended by her physician.

## 2019-12-18 ENCOUNTER — Encounter: Payer: BC Managed Care – PPO | Admitting: Genetic Counselor

## 2019-12-18 ENCOUNTER — Other Ambulatory Visit: Payer: BC Managed Care – PPO

## 2020-01-15 ENCOUNTER — Ambulatory Visit (INDEPENDENT_AMBULATORY_CARE_PROVIDER_SITE_OTHER): Payer: BC Managed Care – PPO

## 2020-01-15 ENCOUNTER — Other Ambulatory Visit: Payer: Self-pay

## 2020-01-15 ENCOUNTER — Ambulatory Visit
Admission: RE | Admit: 2020-01-15 | Discharge: 2020-01-15 | Disposition: A | Discharge status: Home / Self Care | Payer: BC Managed Care – PPO | Source: Ambulatory Visit | Attending: Obstetrics & Gynecology | Admitting: Obstetrics & Gynecology

## 2020-01-15 DIAGNOSIS — Z01812 Encounter for preprocedural laboratory examination: Secondary | ICD-10-CM | POA: Diagnosis not present

## 2020-01-15 DIAGNOSIS — N950 Postmenopausal bleeding: Secondary | ICD-10-CM

## 2020-01-15 LAB — POCT URINE PREGNANCY: Preg Test, Ur: NEGATIVE

## 2020-01-15 NOTE — Progress Notes (Signed)
Pt here for UPT. UPT negative. Pt given confirmation to take to Sonogysterogram appt.

## 2020-01-27 ENCOUNTER — Encounter: Payer: Self-pay | Admitting: Family Medicine

## 2020-01-29 ENCOUNTER — Telehealth (INDEPENDENT_AMBULATORY_CARE_PROVIDER_SITE_OTHER): Payer: Self-pay | Admitting: Family Medicine

## 2020-01-29 ENCOUNTER — Encounter: Payer: Self-pay | Admitting: Family Medicine

## 2020-01-29 DIAGNOSIS — Z20822 Contact with and (suspected) exposure to covid-19: Secondary | ICD-10-CM

## 2020-01-29 DIAGNOSIS — R059 Cough, unspecified: Secondary | ICD-10-CM

## 2020-01-29 LAB — POCT INFLUENZA A/B
Influenza A, POC: NEGATIVE
Influenza B, POC: NEGATIVE

## 2020-01-29 NOTE — Progress Notes (Signed)
Cold sxs runny nose highest temp 99.6, cough (greenish mucus). No sore throat/headache or body aches just tired. Her sxs started 2 days ago. Taking dayquil and nyquil.    Her youngest son had a temp but was tested for flu,covid and strep all of which were negative.

## 2020-01-29 NOTE — Progress Notes (Signed)
    Virtual Visit via Video Note  I connected with Minette Brine on 01/29/20 at 10:10 AM EST by a video enabled telemedicine application and verified that I am speaking with the correct person using two identifiers.   I discussed the limitations of evaluation and management by telemedicine and the availability of in person appointments. The patient expressed understanding and agreed to proceed.  Patient location: at home  Provider location: in office  Subjective:    CC: cough  HPI: 2 days started sneezing and then developed a cough.  Son had fever x 3 days. Other son has a cough. Cold sxs runny nose highest temp 99.6, cough (greenish mucus). No sore throat/headache or body aches just tired. Mild diarrhea x  1 cough. feels like in throat.  Taking dayquil and nyquil.  Been has had some mild upper respiratory symptoms as well.  Supposed return to work on Wednesday.  She is a Engineer, site.  Try to find a place locally to get a rapid Covid swab but it is unavailable because of the high rates of testing in our area.    Past medical history, Surgical history, Family history not pertinant except as noted below, Social history, Allergies, and medications have been entered into the medical record, reviewed, and corrections made.   Review of Systems: No fevers, chills, night sweats, weight loss, chest pain, or shortness of breath.   Objective:    General: Speaking clearly in complete sentences without any shortness of breath.  Alert and oriented x3.  Normal judgment. No apparent acute distress.    Impression and Recommendations:    No problem-specific Assessment & Plan notes found for this encounter.  Upper respiratory infection-recommend to be evaluated for Covid and for influenza.  The meantime recommend symptomatic care.  Call if new or worsening symptoms especially shortness of breath.  Work note provided to return on Thursday assuming that the PCR test will be back for the COVID-19  swab.    Time spent in encounter 15 minutes  I discussed the assessment and treatment plan with the patient. The patient was provided an opportunity to ask questions and all were answered. The patient agreed with the plan and demonstrated an understanding of the instructions.   The patient was advised to call back or seek an in-person evaluation if the symptoms worsen or if the condition fails to improve as anticipated.   Nani Gasser, MD

## 2020-02-01 ENCOUNTER — Telehealth: Payer: Self-pay | Admitting: Family Medicine

## 2020-02-01 LAB — NOVEL CORONAVIRUS, NAA: SARS-CoV-2, NAA: NOT DETECTED

## 2020-02-01 NOTE — Telephone Encounter (Signed)
It has resulted. Dr Linford Arnold is aware.

## 2020-02-01 NOTE — Telephone Encounter (Signed)
Please call [facility] her COVID results have not returned and everybody else that I ordered on Monday has come back.  And her husband's which was done at the exact same time came back yesterday.

## 2020-02-25 ENCOUNTER — Encounter: Payer: Self-pay | Admitting: Family Medicine

## 2020-02-25 DIAGNOSIS — M79671 Pain in right foot: Secondary | ICD-10-CM

## 2020-03-22 ENCOUNTER — Ambulatory Visit (INDEPENDENT_AMBULATORY_CARE_PROVIDER_SITE_OTHER): Payer: BC Managed Care – PPO

## 2020-03-22 ENCOUNTER — Ambulatory Visit: Payer: BC Managed Care – PPO | Admitting: Podiatry

## 2020-03-22 ENCOUNTER — Other Ambulatory Visit: Payer: Self-pay

## 2020-03-22 DIAGNOSIS — M779 Enthesopathy, unspecified: Secondary | ICD-10-CM

## 2020-03-22 DIAGNOSIS — M79671 Pain in right foot: Secondary | ICD-10-CM

## 2020-03-22 DIAGNOSIS — M7750 Other enthesopathy of unspecified foot: Secondary | ICD-10-CM

## 2020-03-22 DIAGNOSIS — M79672 Pain in left foot: Secondary | ICD-10-CM | POA: Diagnosis not present

## 2020-03-22 MED ORDER — MELOXICAM 15 MG PO TABS: 15.0000 mg | ORAL_TABLET | Freq: Every day | ORAL | 0 refills | Status: DC

## 2020-03-25 NOTE — Progress Notes (Signed)
Subjective:   Patient ID: Jasmine Gomez, female   DOB: 45 y.o.   MRN: [id number]   HPI 45 year old female presents the office today with concerns of right foot pain.  She says she is she has had discomfort for about 1 month and she describes pain in the top as well as lateral aspect of her foot.  She is a procedure she stands on her feet most of the day.  She states her primary care physician did give her some arch pads which did not help.  She takes meloxicam on a regular basis for other issues.  She has no other concerns today.   Review of Systems  All other systems reviewed and are negative.  Past Medical History:  Diagnosis Date  . Family history of breast cancer 11/16/2011   Mother and sister with  BrCa.  Patient is neg for BrCa gene.     Marland Kitchen Headache(784.0)   . Migraines     Past Surgical History:  Procedure Laterality Date  . NASAL SINUS SURGERY  1989  . TONSILLECTOMY  1988  . WISDOM TOOTH EXTRACTION       Current Outpatient Medications:  .  meloxicam (MOBIC) 15 MG tablet, Take 1 tablet (15 mg total) by mouth daily., Disp: 30 tablet, Rfl: 0 .  buPROPion (WELLBUTRIN XL) 150 MG 24 hr tablet, TAKE 1 TABLET BY MOUTH EVERY DAY, Disp: 90 tablet, Rfl: 1 .  clindamycin (CLEOCIN T) 1 % external solution, Apply topically 2 (two) times daily., Disp: , Rfl:  .  terbinafine (LAMISIL) 250 MG tablet, Take 250 mg by mouth daily., Disp: , Rfl:   No Known Allergies        Objective:  Physical Exam  General: AAO x3, NAD  Dermatological: Skin is warm, dry and supple bilateral. There are no open sores, no preulcerative lesions, no rash or signs of infection present.  Vascular: Dorsalis Pedis artery and Posterior Tibial artery pedal pulses are 2/4 bilateral with immedate capillary fill time. There is no pain with calf compression, swelling, warmth, erythema.   Neruologic: Grossly intact via light touch bilateral. Negative tinel sign.   Musculoskeletal: There is no area of pinpoint  tenderness identified today.  There is no edema, erythema.  Mild discomfort in the lateral aspect diffusely but no specific area of tenderness identified.  Muscular strength 5/5 in all groups tested bilateral.  Gait: Unassisted, Nonantalgic.       Assessment:   Bilateral foot pain, tendinitis     Plan:  -Treatment options discussed including all alternatives, risks, and complications -Etiology of symptoms were discussed -X-rays obtained reviewed.  No evidence of acute fracture.  Awaiting radiology report. -Added a lateral post incentive for insert to see if this will take pressure of the lateral aspect.  Continue with icing and she can also use Voltaren gel as needed.  Discussed more supportive orthotics discussed shoe modifications.  Trula Slade DPM

## 2020-06-04 ENCOUNTER — Encounter: Payer: Self-pay | Admitting: Family Medicine

## 2020-06-04 ENCOUNTER — Telehealth (INDEPENDENT_AMBULATORY_CARE_PROVIDER_SITE_OTHER): Payer: BC Managed Care – PPO | Admitting: Family Medicine

## 2020-06-04 VITALS — Temp 99.9°F

## 2020-06-04 DIAGNOSIS — J069 Acute upper respiratory infection, unspecified: Secondary | ICD-10-CM

## 2020-06-04 DIAGNOSIS — Z20822 Contact with and (suspected) exposure to covid-19: Secondary | ICD-10-CM

## 2020-06-04 DIAGNOSIS — Z20828 Contact with and (suspected) exposure to other viral communicable diseases: Secondary | ICD-10-CM | POA: Diagnosis not present

## 2020-06-04 LAB — POCT INFLUENZA A/B
Influenza A, POC: NEGATIVE
Influenza B, POC: NEGATIVE

## 2020-06-04 LAB — POCT RAPID STREP A (OFFICE): Rapid Strep A Screen: NEGATIVE

## 2020-06-04 NOTE — Progress Notes (Signed)
Virtual Visit via Video Note  I connected with Jasmine Gomez on 06/04/20 at  2:40 PM EDT by a video enabled telemedicine application and verified that I am speaking with the correct person using two identifiers.   I discussed the limitations of evaluation and management by telemedicine and the availability of in person appointments. The patient expressed understanding and agreed to proceed.  Patient location: at home Provider location: in office  Subjective:    CC: ST  HPI: Symptoms started yesterday she said by dinnertime she was really feeling tired in fact went to bed early.  Fever 99.9, sore throat, nasal congestion,  runny nose, bodyaches and tired and cough. Some post nasal drip.  Cough feels like it is coming more from the drip and drainage in her throat versus deeper in her chest.  Negative home test for COVID.  NO GI upset.  She has had a positive exposure to COVID she has had a positive exposure to flu.   Past medical history, Surgical history, Family history not pertinant except as noted below, Social history, Allergies, and medications have been entered into the medical record, reviewed, and corrections made.    Objective:    General: Speaking clearly in complete sentences without any shortness of breath.  Alert and oriented x3.  Normal judgment. No apparent acute distress.    Impression and Recommendations:    No problem-specific Assessment & Plan notes found for this encounter.  Upper respiratory infection with exposure to COVID and flu-we will test for COVID with PCR and rapid flu test.  We will also plan to test for strep throat since she has a very sore throat.  Mid and symptomatic care and rest and hydration.  Orders Placed This Encounter  Procedures  . Novel Coronavirus, NAA ([facility])    Order Specific Question:   Is this test for diagnosis or screening    Answer:   Diagnosis of ill patient    Order Specific Question:   Symptomatic for COVID-19 as defined  by CDC    Answer:   Yes    Order Specific Question:   Date of Symptom Onset    Answer:   06/03/2020    Order Specific Question:   Hospitalized for COVID-19    Answer:   No    Order Specific Question:   Admitted to ICU for COVID-19    Answer:   No    Order Specific Question:   Previously tested for COVID-19    Answer:   Yes    Order Specific Question:   Resident in a congregate (group) care setting    Answer:   No    Order Specific Question:   Is the patient student?    Answer:   No    Order Specific Question:   Employed in healthcare setting    Answer:   No    Order Specific Question:   Pregnant    Answer:   No    Order Specific Question:   Has patient completed COVID vaccination(s) (2 doses of Pfizer/Moderna 1 dose of [employer])    Answer:   Yes    Order Specific Question:   Has patient completed COVID Booster / 3rd dose    Answer:   Yes  . POCT Influenza A/B  . POCT rapid strep A    No orders of the defined types were placed in this encounter.    I discussed the assessment and treatment plan with the patient. The patient was  provided an opportunity to ask questions and all were answered. The patient agreed with the plan and demonstrated an understanding of the instructions.   The patient was advised to call back or seek an in-person evaluation if the symptoms worsen or if the condition fails to improve as anticipated.   Beatrice Lecher, MD

## 2020-06-04 NOTE — Progress Notes (Signed)
labPt reports that she was exposed yesterday. 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 One of her children was running a fever last week.   She sxs have been sore throat runny stuffy nose some body aches and cough. She has tried nyquil.   She took an at home covid test yesterday that was negative.

## 2020-06-05 LAB — NOVEL CORONAVIRUS, NAA: SARS-CoV-2, NAA: NOT DETECTED

## 2020-06-05 LAB — SARS-COV-2, NAA 2 DAY TAT

## 2020-06-09 ENCOUNTER — Encounter: Payer: Self-pay | Admitting: Family Medicine

## 2020-06-10 MED ORDER — PREDNISONE 20 MG PO TABS
40.0000 mg | ORAL_TABLET | Freq: Every day | ORAL | 0 refills | Status: DC
Start: 2020-06-10 — End: 2020-10-01

## 2020-06-10 MED ORDER — AZITHROMYCIN 250 MG PO TABS: ORAL_TABLET | ORAL | 0 refills | Status: AC

## 2020-09-30 ENCOUNTER — Encounter: Payer: Self-pay | Admitting: Family Medicine

## 2020-10-01 ENCOUNTER — Encounter: Payer: Self-pay | Admitting: Medical-Surgical

## 2020-10-01 ENCOUNTER — Telehealth (INDEPENDENT_AMBULATORY_CARE_PROVIDER_SITE_OTHER): Payer: BC Managed Care – PPO | Admitting: Medical-Surgical

## 2020-10-01 DIAGNOSIS — U071 COVID-19: Secondary | ICD-10-CM | POA: Diagnosis not present

## 2020-10-01 MED ORDER — NIRMATRELVIR/RITONAVIR (PAXLOVID)TABLET: 3.0000 | ORAL_TABLET | Freq: Two times a day (BID) | ORAL | 0 refills | Status: AC

## 2020-10-01 NOTE — Progress Notes (Signed)
Covid + yesterday Congestion, fever, cough, sinus headache

## 2020-10-01 NOTE — Progress Notes (Signed)
Virtual Visit via Video Note  I connected with Jasmine Gomez on 10/01/20 at  3:00 PM EDT by a video enabled telemedicine application and verified that I am speaking with the correct person using two identifiers.   I discussed the limitations of evaluation and management by telemedicine and the availability of in person appointments. The patient expressed understanding and agreed to proceed.  Patient location: home Provider locations: office  Subjective:    CC: COVID-positive  HPI: Pleasant 45 year old female presenting via MyChart video visit with reports of testing positive for COVID last night.  Her symptoms started on 9/4 in the evening when she was at dinner.  She started with sneezing but this progressed quickly to fever T-max 101, facial pressure/pain, headache, ears stuffy, and severe sinus congestion.  She has several family members who are also sick and tested positive for COVID.  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  Denies chills, chest pain, shortness of breath, GI symptoms, and alterations of taste and smell.  Past medical history, Surgical history, Family history not pertinant except as noted below, Social history, Allergies, and medications have been entered into the medical record, reviewed, and corrections made.   Review of Systems: See HPI for pertinent positives and negatives.   Objective:    General: Speaking clearly in complete sentences without any shortness of breath.  Alert and oriented x3.  Normal judgment. No apparent acute distress.  Impression and Recommendations:    1. COVID-19 virus infection Discussed options for treatment.  Patient is okay with trying an oral antiviral so sending in Paxlovid twice daily x5 days.  Recommend symptomatic treatment with over-the-counter agents, Tylenol, ibuprofen, and nasal sprays.  I discussed the assessment and treatment plan with the patient. The patient was provided an  opportunity to ask questions and all were answered. The patient agreed with the plan and demonstrated an understanding of the instructions.   The patient was advised to call back or seek an in-person evaluation if the symptoms worsen or if the condition fails to improve as anticipated.  20 minutes of non-face-to-face time was provided during this encounter.  Return if symptoms worsen or fail to improve.  Clearnce Sorrel, DNP, APRN, FNP-BC [facility]

## 2020-10-18 IMAGING — US US PELVIS COMPLETE WITH TRANSVAGINAL
1 series · 13 of 25 positions shown · non-contrast
Comparison: 10/21/2018

CLINICAL DATA: Postmenopausal bleeding.

EXAM:
TRANSABDOMINAL AND TRANSVAGINAL ULTRASOUND OF PELVIS
TECHNIQUE: Both transabdominal and transvaginal ultrasound examinations of the
pelvis were performed. Transabdominal technique was performed for
global imaging of the pelvis including uterus, ovaries, adnexal
regions, and pelvic cul-de-sac. It was necessary to proceed with
endovaginal exam following the transabdominal exam to visualize the
endometrium and ovaries.

[Series 1: us pelvis complete with transvaginal · 0.24mm/px · 13 of 191 slices shown]
[im 1/191]
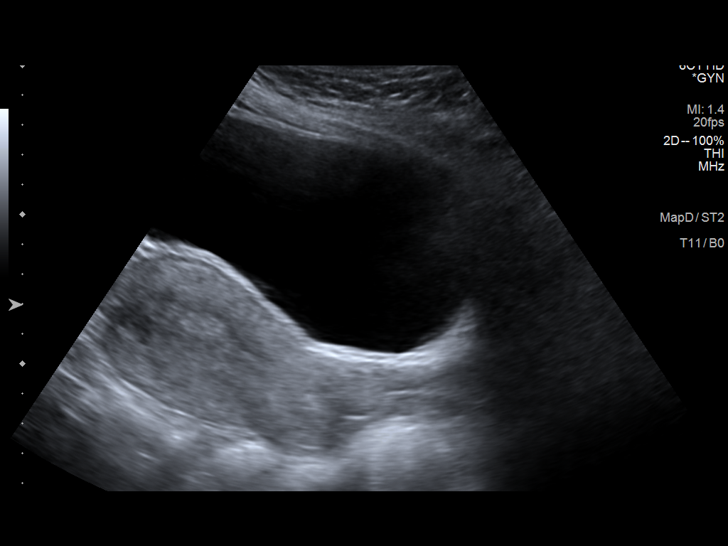
[im 16/191]
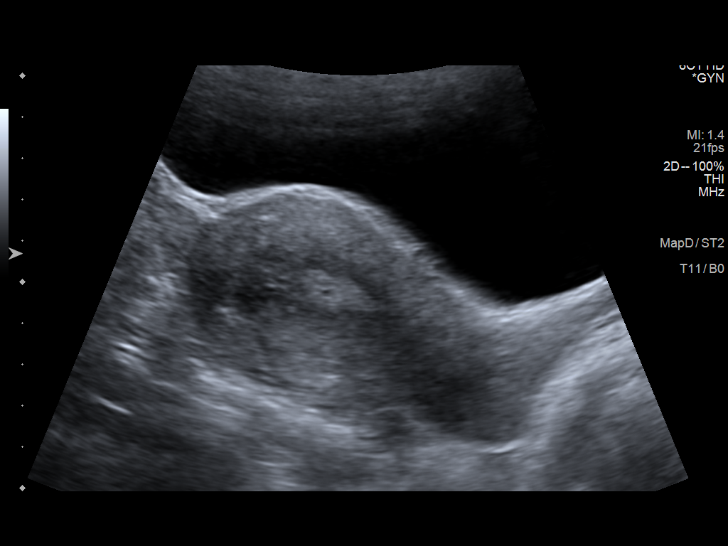
[im 32/191]
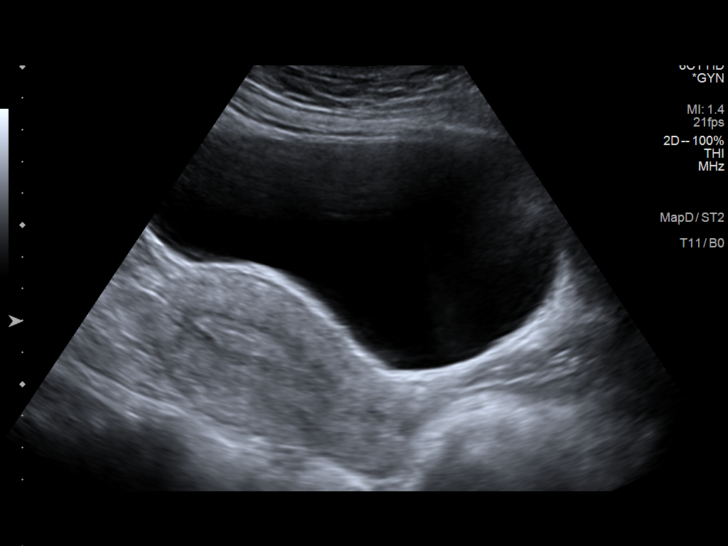
[im 48/191]
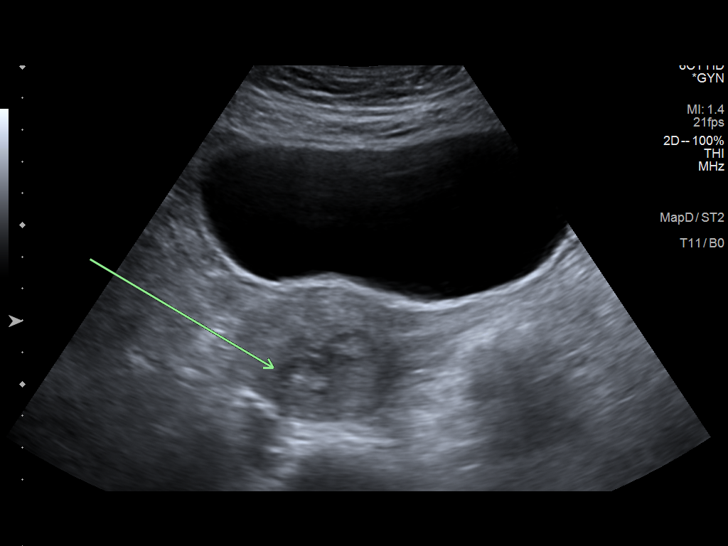
[im 64/191]
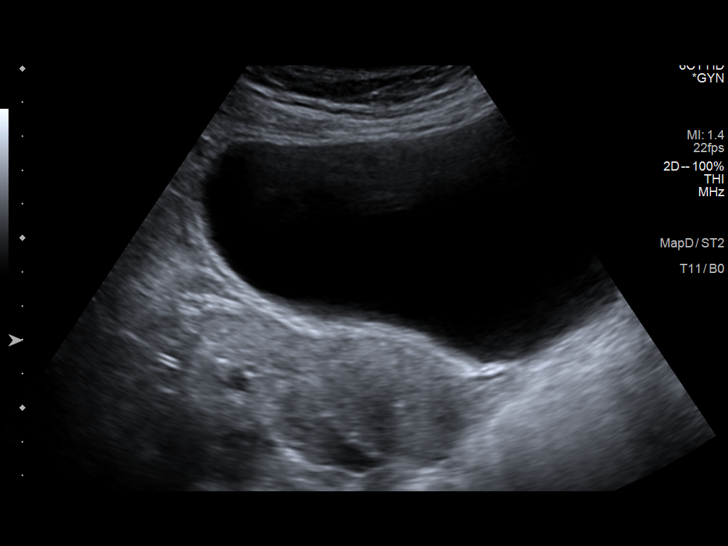
[im 80/191]
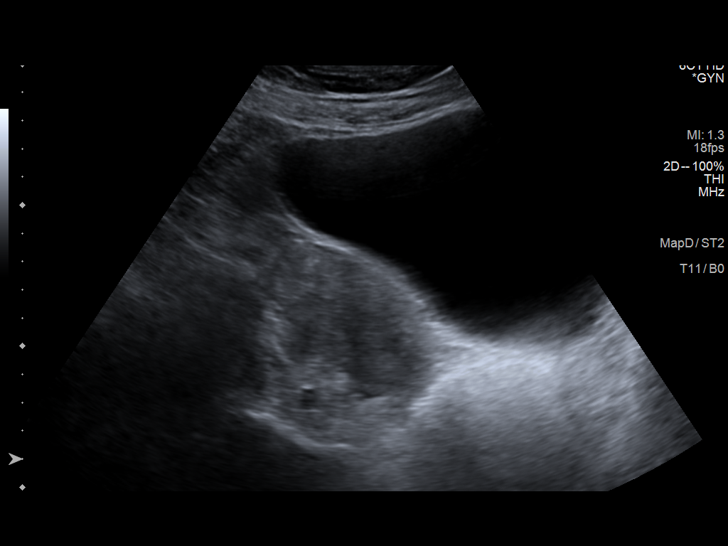
[im 96/191]
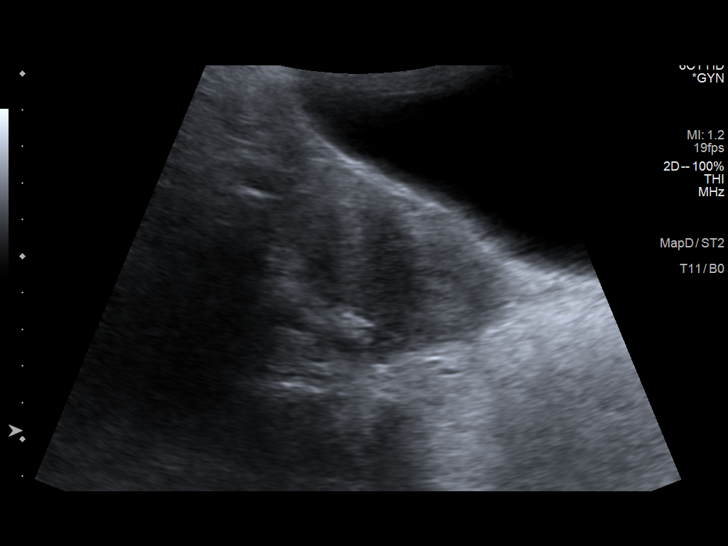
[im 111/191]
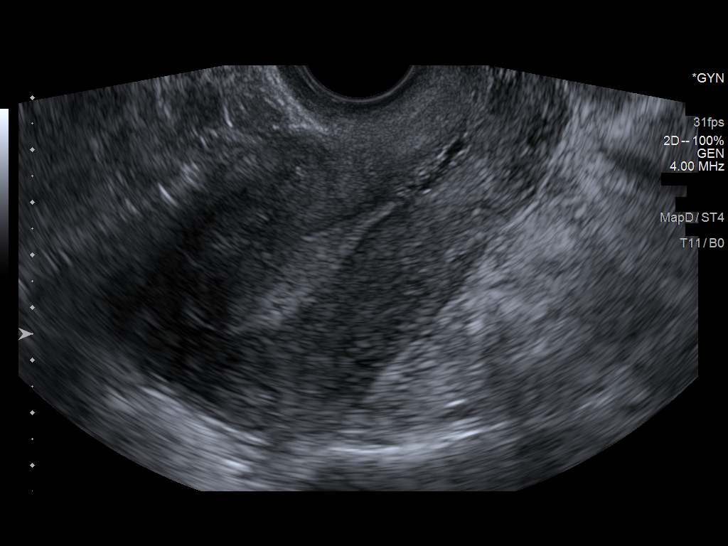
[im 127/191]
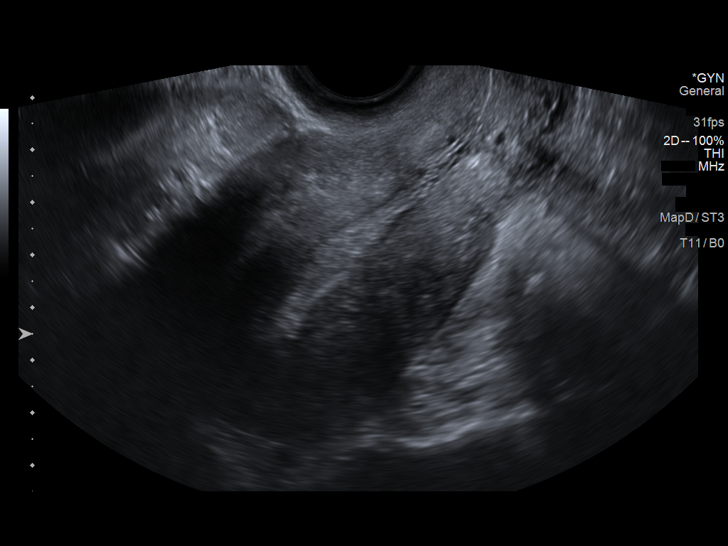
[im 143/191]
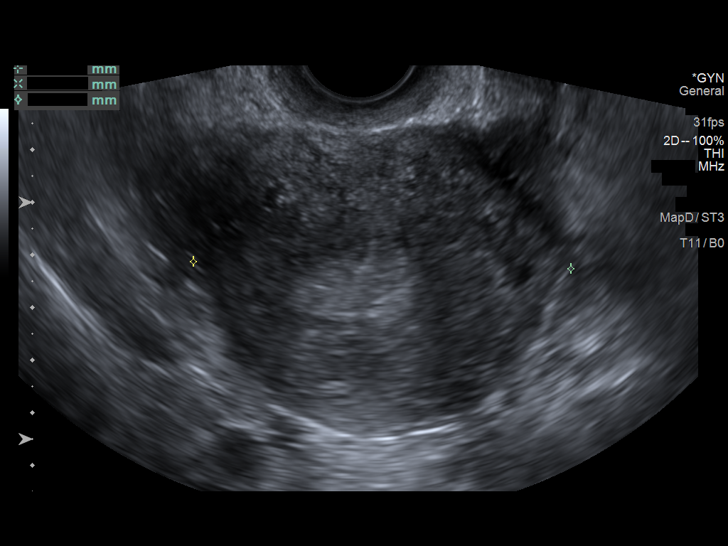
[im 159/191]
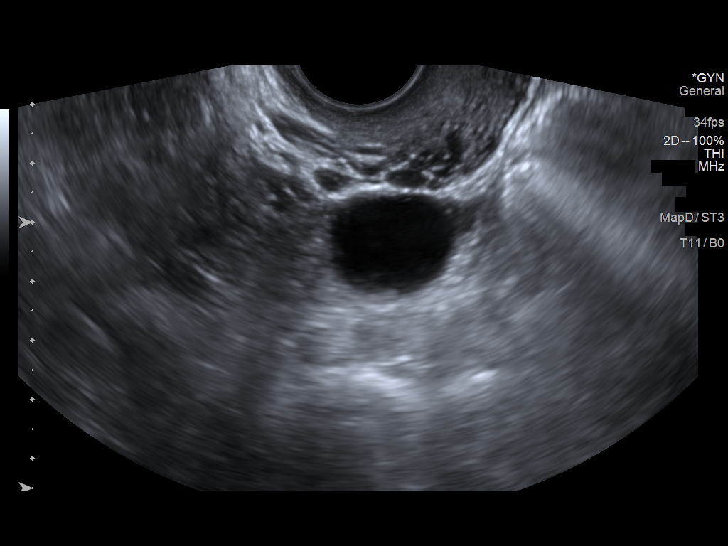
[im 175/191]
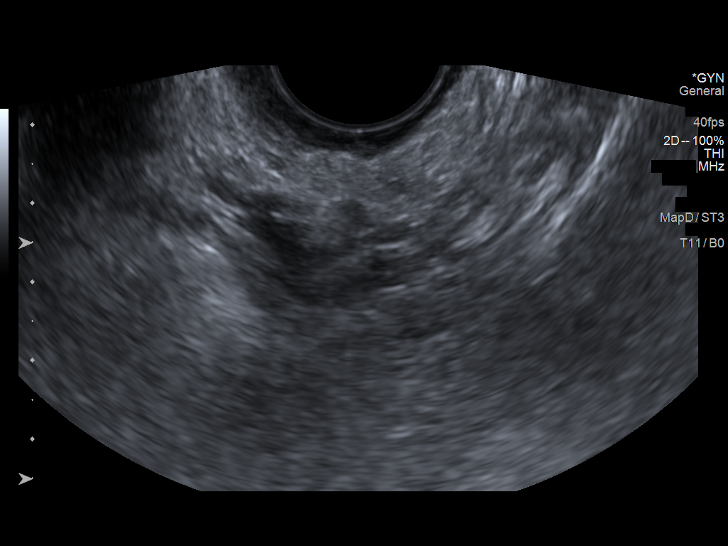
[im 191/191]
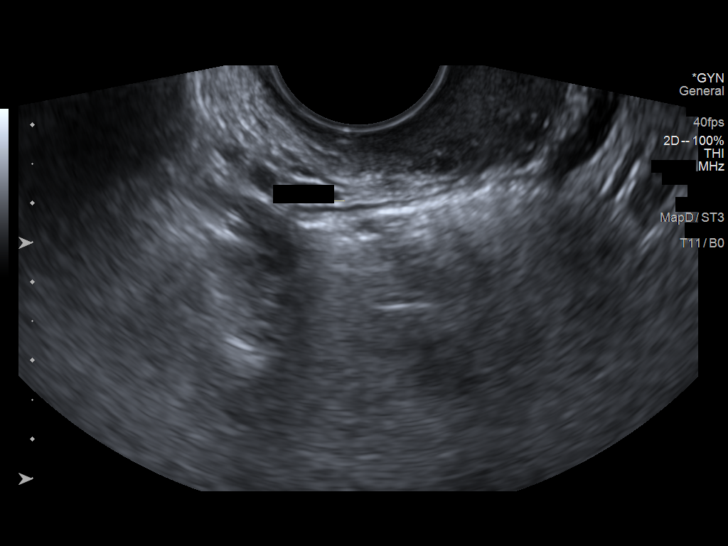

[13 of 25 positions shown; findings below may reference images not displayed]

FINDINGS: Uterus

Measurements: 9.3 x 4.7 x 6.5 cm = volume: 148 mL. A 1.6 cm
intramural fibroid is again seen in the posterior fundal region. No
other fibroids identified.

Endometrium

Thickness: 10 mm.  No focal abnormality visualized.

Right ovary

Measurements: 1.7 x 3.5 x 3.6 cm = volume: 11 mL. 2.4 cm simple
cyst, without significant change compared to prior exam.

Left ovary

Measurements: 1.7 x 1.4 x 2.0 cm = volume: 2 mL. Normal
appearance/no adnexal mass.

Other findings

No abnormal free fluid.
IMPRESSION: Endometrial thickness measures 10 mm. In the setting of
post-menopausal bleeding, endometrial sampling is indicated to
exclude carcinoma. If results are benign, sonohysterogram should be
considered for focal lesion work-up. (Ref: Radiological Reasoning:
Algorithmic Workup of Abnormal Vaginal Bleeding with Endovaginal
Sonography and Sonohysterography. AJR 3332; 191:S68-73).

1.6 cm intramural fibroid in the posterior fundal region.

2.4 cm simple right ovarian cyst, without significant change
compared to prior exam. No followup imaging recommended. Note: This
recommendation does not apply to premenarchal patients or to those
with increased risk (genetic, family history, elevated tumor markers
or other high-risk factors) of ovarian cancer. Reference: Radiology
[DATE]):[id number].

## 2020-10-21 ENCOUNTER — Ambulatory Visit: Payer: BC Managed Care – PPO | Admitting: Family Medicine

## 2020-10-26 HISTORY — PX: WRIST MASS EXCISION: SHX2674

## 2020-11-14 ENCOUNTER — Ambulatory Visit (INDEPENDENT_AMBULATORY_CARE_PROVIDER_SITE_OTHER): Payer: BC Managed Care – PPO | Admitting: Family Medicine

## 2020-11-14 ENCOUNTER — Other Ambulatory Visit: Payer: Self-pay

## 2020-11-14 VITALS — BP 117/85 | HR 90 | Ht 64.0 in | Wt 171.3 lb

## 2020-11-14 DIAGNOSIS — Z1211 Encounter for screening for malignant neoplasm of colon: Secondary | ICD-10-CM

## 2020-11-14 DIAGNOSIS — Z0000 Encounter for general adult medical examination without abnormal findings: Secondary | ICD-10-CM

## 2020-11-14 NOTE — Progress Notes (Signed)
Subjective:     Jasmine Gomez is a 45 y.o. female and is here for a comprehensive physical exam. The patient reports no problems.  She is doing well.  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  She has been walking for exercise.  Social History   Socioeconomic History   Marital status: Married    Spouse name: Legrand Como   Number of children: 2   Years of education: 17   Highest education level: Not on file  Occupational History   Occupation: Pharmacist, hospital    Comment: [employer]  Tobacco Use   Smoking status: Former    Years: 10.00    Types: Cigarettes    Quit date: 01/26/2009    Years since quitting: 11.8   Smokeless tobacco: Never  Vaping Use   Vaping Use: Never used  Substance and Sexual Activity   Alcohol use: Yes    Alcohol/week: 2.0 standard drinks    Types: 2 Glasses of wine per week   Drug use: No   Sexual activity: Yes    Partners: Male    Comment: Vasectomy  Other Topics Concern   Not on file  Social History Narrative   No regular exercise.     Social Determinants of Health   Financial Resource Strain: Not on file  Food Insecurity: Not on file  Transportation Needs: Not on file  Physical Activity: Not on file  Stress: Not on file  Social Connections: Not on file  Intimate Partner Violence: Not on file   Health Maintenance  Topic Date Due   Pneumococcal Vaccine 58-27 Years old (1 - PCV) Never done   Hepatitis C Screening  Never done   INFLUENZA VACCINE  08/26/2020   COLONOSCOPY (Pts 45-30yrs Insurance coverage will need to be confirmed)  Never done   COVID-19 Vaccine (5 - Booster) 12/26/2020 (Originally 12/25/2019)   PAP SMEAR-Modifier  03/15/2022   TETANUS/TDAP  06/02/2023   HIV Screening  Completed   HPV VACCINES  Aged Out    The following portions of the patient's history were reviewed and updated as appropriate: allergies, current medications, past family history, past medical history, past social history, past surgical history, and  problem list.  Review of Systems Pertinent items are noted in HPI.   Objective:    BP 117/85   Pulse 90   Ht 5\' 4"  (1.626 m)   Wt 171 lb 4.8 oz (77.7 kg)   SpO2 95%   BMI 29.40 kg/m  General appearance: alert, cooperative, and appears stated age Head: Normocephalic, without obvious abnormality, atraumatic Eyes:  conj clear, EOMi, PEERLA Ears: normal TM's and external ear canals both ears Nose: Nares normal. Septum midline. Mucosa normal. No drainage or sinus tenderness. Throat: lips, mucosa, and tongue normal; teeth and gums normal Neck: no adenopathy, no carotid bruit, no JVD, supple, symmetrical, trachea midline, and thyroid not enlarged, symmetric, no tenderness/mass/nodules Back: symmetric, no curvature. ROM normal. No CVA tenderness. Lungs: clear to auscultation bilaterally Heart: regular rate and rhythm, S1, S2 normal, no murmur, click, rub or gallop Abdomen: soft, non-tender; bowel sounds normal; no masses,  no organomegaly Extremities: extremities normal, atraumatic, no cyanosis or edema Pulses: 2+ and symmetric Skin: Skin color, texture, turgor normal. No rashes or lesions Lymph nodes: Cervical, supraclavicular, and axillary nodes normal. Neurologic: Alert and oriented X 3, normal strength and tone. Normal symmetric reflexes. Normal coordination and gait    Assessment:    Healthy female exam.      Plan:  See After Visit Summary for Counseling Recommendations  complete physical examination Keep up a regular exercise program and make sure you are eating a healthy diet Try to eat 4 servings of dairy a day, or if you are lactose intolerant take a calcium with vitamin D daily.  Your vaccines are up to date.  Given the flu shot today Discussed colon cancer screening.

## 2020-11-14 NOTE — Patient Instructions (Signed)
Preventive Care 21-45 Years Old, Female Preventive care refers to lifestyle choices and visits with your health care provider that can promote health and wellness. This includes: A yearly physical exam. This is also called an annual wellness visit. Regular dental and eye exams. Immunizations. Screening for certain conditions. Healthy lifestyle choices, such as: Eating a healthy diet. Getting regular exercise. Not using drugs or products that contain nicotine and tobacco. Limiting alcohol use. What can I expect for my preventive care visit? Physical exam Your health care provider may check your: Height and weight. These may be used to calculate your BMI (body mass index). BMI is a measurement that tells if you are at a healthy weight. Heart rate and blood pressure. Body temperature. Skin for abnormal spots. Counseling Your health care provider may ask you questions about your: Past medical problems. Family's medical history. Alcohol, tobacco, and drug use. Emotional well-being. Home life and relationship well-being. Sexual activity. Diet, exercise, and sleep habits. Work and work environment. Access to firearms. Method of birth control. Menstrual cycle. Pregnancy history. What immunizations do I need? Vaccines are usually given at various ages, according to a schedule. Your health care provider will recommend vaccines for you based on your age, medical history, and lifestyle or other factors, such as travel or where you work. What tests do I need? Blood tests Lipid and cholesterol levels. These may be checked every 5 years starting at age 20. Hepatitis C test. Hepatitis B test. Screening Diabetes screening. This is done by checking your blood sugar (glucose) after you have not eaten for a while (fasting). STD (sexually transmitted disease) testing, if you are at risk. BRCA-related cancer screening. This may be done if you have a family history of breast, ovarian, tubal, or  peritoneal cancers. Pelvic exam and Pap test. This may be done every 3 years starting at age 21. Starting at age 30, this may be done every 5 years if you have a Pap test in combination with an HPV test. Talk with your health care provider about your test results, treatment options, and if necessary, the need for more tests. Follow these instructions at home: Eating and drinking  Eat a healthy diet that includes fresh fruits and vegetables, whole grains, lean protein, and low-fat dairy products. Take vitamin and mineral supplements as recommended by your health care provider. Do not drink alcohol if: Your health care provider tells you not to drink. You are pregnant, may be pregnant, or are planning to become pregnant. If you drink alcohol: Limit how much you have to 0-1 drink a day. Be aware of how much alcohol is in your drink. In the [place] one drink equals one 12 oz bottle of beer (355 mL), one 5 oz glass of wine (148 mL), or one 1 oz glass of hard liquor (44 mL). Lifestyle Take daily care of your teeth and gums. Brush your teeth every morning and night with fluoride toothpaste. Floss one time each day. Stay active. Exercise for at least 30 minutes 5 or more days each week. Do not use any products that contain nicotine or tobacco, such as cigarettes, e-cigarettes, and chewing tobacco. If you need help quitting, ask your health care provider. Do not use drugs. If you are sexually active, practice safe sex. Use a condom or other form of protection to prevent STIs (sexually transmitted infections). If you do not wish to become pregnant, use a form of birth control. If you plan to become pregnant, see your health care provider   for a prepregnancy visit. Find healthy ways to cope with stress, such as: Meditation, yoga, or listening to music. Journaling. Talking to a trusted person. Spending time with friends and family. Safety Always wear your seat belt while driving or riding in a  vehicle. Do not drive: If you have been drinking alcohol. Do not ride with someone who has been drinking. When you are tired or distracted. While texting. Wear a helmet and other protective equipment during sports activities. If you have firearms in your house, make sure you follow all gun safety procedures. Seek help if you have been physically or sexually abused. What's next? Go to your health care provider once a year for an annual wellness visit. Ask your health care provider how often you should have your eyes and teeth checked. Stay up to date on all vaccines. This information is not intended to replace advice given to you by your health care provider. Make sure you discuss any questions you have with your health care provider. Document Revised: 03/22/2020 Document Reviewed: 09/23/2017 Elsevier Patient Education  2022 [employer]  

## 2020-12-16 ENCOUNTER — Other Ambulatory Visit: Payer: Self-pay | Admitting: Obstetrics & Gynecology

## 2020-12-16 DIAGNOSIS — Z1231 Encounter for screening mammogram for malignant neoplasm of breast: Secondary | ICD-10-CM

## 2020-12-18 ENCOUNTER — Ambulatory Visit: Payer: BC Managed Care – PPO

## 2021-01-01 ENCOUNTER — Ambulatory Visit (INDEPENDENT_AMBULATORY_CARE_PROVIDER_SITE_OTHER): Payer: BC Managed Care – PPO

## 2021-01-01 ENCOUNTER — Other Ambulatory Visit: Payer: Self-pay

## 2021-01-01 DIAGNOSIS — Z1231 Encounter for screening mammogram for malignant neoplasm of breast: Secondary | ICD-10-CM | POA: Diagnosis not present

## 2021-02-12 ENCOUNTER — Other Ambulatory Visit: Payer: Self-pay

## 2021-02-12 ENCOUNTER — Ambulatory Visit (AMBULATORY_SURGERY_CENTER): Payer: BC Managed Care – PPO | Admitting: *Deleted

## 2021-02-12 VITALS — Ht 64.0 in | Wt 170.0 lb

## 2021-02-12 DIAGNOSIS — Z1211 Encounter for screening for malignant neoplasm of colon: Secondary | ICD-10-CM

## 2021-02-12 MED ORDER — NA SULFATE-K SULFATE-MG SULF 17.5-3.13-1.6 GM/177ML PO SOLN: 1.0000 | ORAL | 0 refills | Status: DC

## 2021-02-12 NOTE — Progress Notes (Signed)

## 2021-02-20 ENCOUNTER — Encounter: Payer: Self-pay | Admitting: Gastroenterology

## 2021-02-26 ENCOUNTER — Ambulatory Visit (AMBULATORY_SURGERY_CENTER): Payer: BC Managed Care – PPO | Admitting: Gastroenterology

## 2021-02-26 ENCOUNTER — Encounter: Payer: Self-pay | Admitting: Gastroenterology

## 2021-02-26 VITALS — BP 113/75 | HR 64 | Temp 98.4°F | Resp 11 | Ht 64.0 in | Wt 170.0 lb

## 2021-02-26 DIAGNOSIS — D127 Benign neoplasm of rectosigmoid junction: Secondary | ICD-10-CM

## 2021-02-26 DIAGNOSIS — Z1211 Encounter for screening for malignant neoplasm of colon: Secondary | ICD-10-CM

## 2021-02-26 DIAGNOSIS — K635 Polyp of colon: Secondary | ICD-10-CM

## 2021-02-26 DIAGNOSIS — D129 Benign neoplasm of anus and anal canal: Secondary | ICD-10-CM

## 2021-02-26 DIAGNOSIS — D128 Benign neoplasm of rectum: Secondary | ICD-10-CM

## 2021-02-26 MED ORDER — SODIUM CHLORIDE 0.9 % IV SOLN: 500.0000 mL | Freq: Once | INTRAVENOUS | Status: DC

## 2021-02-26 NOTE — Op Note (Signed)
[place] Patient Name: Jasmine Gomez Procedure Date: 02/26/2021 11:06 AM MRN: [id number] Endoscopist: Justice Britain , MD Age: 46 Referring MD:  Date of Birth: February 03, 1975 Gender: Female Account #: [id number] Procedure:                Colonoscopy Indications:              Screening for colorectal malignant neoplasm, This                            is the patient's first colonoscopy Medicines:                Monitored Anesthesia Care Procedure:                Pre-Anesthesia Assessment:                           - Prior to the procedure, a History and Physical                            was performed, and patient medications and                            allergies were reviewed. The patient's tolerance of                            previous anesthesia was also reviewed. The risks                            and benefits of the procedure and the sedation                            options and risks were discussed with the patient.                            All questions were answered, and informed consent                            was obtained. Prior Anticoagulants: The patient has                            taken no previous anticoagulant or antiplatelet                            agents except for NSAID medication. ASA Grade                            Assessment: II - A patient with mild systemic                            disease. After reviewing the risks and benefits,                            the patient was deemed in satisfactory condition to  undergo the procedure.                           After obtaining informed consent, the colonoscope                            was passed under direct vision. Throughout the                            procedure, the patient's blood pressure, pulse, and                            oxygen saturations were monitored continuously. The                            CF [id number] #[id number] was introduced  through the anus                            and advanced to the 5 cm into the ileum. The                            colonoscopy was performed without difficulty. The                            patient tolerated the procedure. The quality of the                            bowel preparation was good. The terminal ileum,                            ileocecal valve, appendiceal orifice, and rectum                            were photographed. Scope In: 11:19:41 AM Scope Out: 11:32:17 AM Scope Withdrawal Time: 0 hours 10 minutes 9 seconds  Total Procedure Duration: 0 hours 12 minutes 36 seconds  Findings:                 The digital rectal exam findings include                            hemorrhoids. Pertinent negatives include no                            palpable rectal lesions.                           The terminal ileum and ileocecal valve appeared                            normal.                           Two sessile polyps were found in the rectum and  recto-sigmoid colon. The polyps were 2 to 3 mm in                            size. These polyps were removed with a cold snare.                            Resection and retrieval were complete.                           Normal mucosa was found in the entire colon                            otherwise.                           Non-bleeding non-thrombosed external and internal                            hemorrhoids were found during retroflexion, during                            perianal exam and during digital exam. The                            hemorrhoids were Grade II (internal hemorrhoids                            that prolapse but reduce spontaneously). Complications:            No immediate complications. Estimated Blood Loss:     Estimated blood loss was minimal. Impression:               - Hemorrhoids found on digital rectal exam.                           - The examined portion of the ileum was  normal.                           - Two 2 to 3 mm polyps in the rectum and at the                            recto-sigmoid colon, removed with a cold snare.                            Resected and retrieved.                           - Normal mucosa in the entire examined colon                            otherwise.                           - Non-bleeding non-thrombosed external and internal  hemorrhoids. Recommendation:           - The patient will be observed post-procedure,                            until all discharge criteria are met.                           - Discharge patient to home.                           - Patient has a contact number available for                            emergencies. The signs and symptoms of potential                            delayed complications were discussed with the                            patient. Return to normal activities tomorrow.                            Written discharge instructions were provided to the                            patient.                           - High fiber diet.                           - Use FiberCon 1-2 tablets PO daily.                           - Continue present medications.                           - Await pathology results.                           - Repeat colonoscopy in 5-10 years for surveillance                            based on pathology results.                           - The findings and recommendations were discussed                            with the patient.                           - The findings and recommendations were discussed                            with the patient's family. Justice Britain, MD 02/26/2021 11:43:26 AM

## 2021-02-26 NOTE — Patient Instructions (Signed)
Please read handouts provided. ?Continue present medications. ?Await pathology results. ?High Fiber Diet. ?Use FiberCon 1-2 tablets daily. ? ? ?YOU HAD AN ENDOSCOPIC PROCEDURE TODAY AT THE [facility]:   Refer to the procedure report that was given to you for any specific questions about what was found during the examination.  If the procedure report does not answer your questions, please call your gastroenterologist to clarify.  If you requested that your care partner not be given the details of your procedure findings, then the procedure report has been included in a sealed envelope for you to review at your convenience later. ? ?YOU SHOULD EXPECT: Some feelings of bloating in the abdomen. Passage of more gas than usual.  Walking can help get rid of the air that was put into your GI tract during the procedure and reduce the bloating. If you had a lower endoscopy (such as a colonoscopy or flexible sigmoidoscopy) you may notice spotting of blood in your stool or on the toilet paper. If you underwent a bowel prep for your procedure, you may not have a normal bowel movement for a few days. ? ?Please Note:  You might notice some irritation and congestion in your nose or some drainage.  This is from the oxygen used during your procedure.  There is no need for concern and it should clear up in a day or so. ? ?SYMPTOMS TO REPORT IMMEDIATELY: ? ?Following lower endoscopy (colonoscopy or flexible sigmoidoscopy): ? Excessive amounts of blood in the stool ? Significant tenderness or worsening of abdominal pains ? Swelling of the abdomen that is new, acute ? Fever of 100?F or higher ? ? ?For urgent or emergent issues, a gastroenterologist can be reached at any hour by calling [phone]. ?Do not use MyChart messaging for urgent concerns.  ? ? ?DIET:  We do recommend a small meal at first, but then you may proceed to your regular diet.  Drink plenty of fluids but you should avoid alcoholic beverages for 24  hours. ? ?ACTIVITY:  You should plan to take it easy for the rest of today and you should NOT DRIVE or use heavy machinery until tomorrow (because of the sedation medicines used during the test).   ? ?FOLLOW UP: ?Our staff will call the number listed on your records 48-72 hours following your procedure to check on you and address any questions or concerns that you may have regarding the information given to you following your procedure. If we do not reach you, we will leave a message.  We will attempt to reach you two times.  During this call, we will ask if you have developed any symptoms of COVID 19. If you develop any symptoms (ie: fever, flu-like symptoms, shortness of breath, cough etc.) before then, please call [phone].  If you test positive for Covid 19 in the 2 weeks post procedure, please call and report this information to us.   ? ?If any biopsies were taken you will be contacted by phone or by letter within the next 1-3 weeks.  Please call us at [phone] if you have not heard about the biopsies in 3 weeks.  ? ? ?SIGNATURES/CONFIDENTIALITY: ?You and/or your care partner have signed paperwork which will be entered into your electronic medical record.  These signatures attest to the fact that that the information above on your After Visit Summary has been reviewed and is understood.  Full responsibility of the confidentiality of this discharge information lies with you and/or your   care-partner.  ?

## 2021-02-26 NOTE — Progress Notes (Signed)
Called to room to assist during endoscopic procedure.  Patient ID and intended procedure confirmed with present staff. Received instructions for my participation in the procedure from the performing physician.  

## 2021-02-26 NOTE — Progress Notes (Signed)
VS  DT ? ?Pt's states no medical or surgical changes since previsit or office visit. ? ?

## 2021-02-26 NOTE — Progress Notes (Signed)
Report to PACU, RN, vss, BBS= Clear.  

## 2021-02-26 NOTE — Progress Notes (Signed)
GASTROENTEROLOGY PROCEDURE H&P NOTE   Primary Care Physician: Hali Marry, MD  HPI: Jasmine Gomez is a 46 y.o. female who presents for Colonoscopy for screening.  Past Medical History:  Diagnosis Date   Anxiety    Family history of breast cancer 11/16/2011   Mother and sister with  BrCa.  Patient is neg for BrCa gene.      Headache(784.0)    Migraines    Past Surgical History:  Procedure Laterality Date   NASAL SINUS SURGERY  1989   TONSILLECTOMY  1988   WISDOM TOOTH EXTRACTION     WRIST MASS EXCISION  10/2020   Current Outpatient Medications  Medication Sig Dispense Refill   ibuprofen (ADVIL) 200 MG tablet Take 200 mg by mouth every 6 (six) hours as needed.     busPIRone (BUSPAR) 5 MG tablet Take 5 mg by mouth as needed. (Patient not taking: Reported on 02/12/2021)     Current Facility-Administered Medications  Medication Dose Route Frequency Provider Last Rate Last Admin   0.9 %  sodium chloride infusion  500 mL Intravenous Once Mansouraty, Telford Nab., MD        Current Outpatient Medications:    ibuprofen (ADVIL) 200 MG tablet, Take 200 mg by mouth every 6 (six) hours as needed., Disp: , Rfl:    busPIRone (BUSPAR) 5 MG tablet, Take 5 mg by mouth as needed. (Patient not taking: Reported on 02/12/2021), Disp: , Rfl:   Current Facility-Administered Medications:    0.9 %  sodium chloride infusion, 500 mL, Intravenous, Once, Mansouraty, Telford Nab., MD No Known Allergies Family History  Problem Relation Age of Onset   Breast cancer Mother 31       again 4   BRCA 1/2 Mother    Hyperlipidemia Father    Breast cancer Sister 48       during pregnancy   BRCA 1/2 Sister    Breast cancer Maternal Grandmother 65   Cancer Paternal Grandfather        sinus cancer   Colon cancer Neg Hx    Colon polyps Neg Hx    Esophageal cancer Neg Hx    Rectal cancer Neg Hx    Stomach cancer Neg Hx    Social History   Socioeconomic History   Marital status: Married     Spouse name: Legrand Como   Number of children: 2   Years of education: 65   Highest education level: Not on file  Occupational History   Occupation: Pharmacist, hospital    Comment: [employer]  Tobacco Use   Smoking status: Former    Years: 10.00    Types: Cigarettes    Quit date: 01/26/2009    Years since quitting: 12.0   Smokeless tobacco: Never  Vaping Use   Vaping Use: Never used  Substance and Sexual Activity   Alcohol use: Yes    Alcohol/week: 6.0 standard drinks    Types: 6 Glasses of wine per week   Drug use: No   Sexual activity: Yes    Partners: Male    Comment: Vasectomy  Other Topics Concern   Not on file  Social History Narrative   No regular exercise.     Social Determinants of Health   Financial Resource Strain: Not on file  Food Insecurity: Not on file  Transportation Needs: Not on file  Physical Activity: Not on file  Stress: Not on file  Social Connections: Not on file  Intimate Partner Violence: Not on file  Physical Exam: Today's Vitals   02/26/21 1045  BP: 110/77  Pulse: 71  Temp: 98.4 F (36.9 C)  TempSrc: Temporal  SpO2: 99%  Weight: 170 lb (77.1 kg)  Height: _0  (1.626 m)   Body mass index is 29.18 kg/m. GEN: NAD EYE: Sclerae anicteric ENT: MMM CV: Non-tachycardic GI: Soft, NT/ND NEURO:  Alert & Oriented x 3  Lab Results: No results for input(s): WBC, HGB, HCT, PLT in the last 72 hours. BMET No results for input(s): NA, K, CL, CO2, GLUCOSE, BUN, CREATININE, CALCIUM in the last 72 hours. LFT No results for input(s): PROT, ALBUMIN, AST, ALT, ALKPHOS, BILITOT, BILIDIR, IBILI in the last 72 hours. PT/INR No results for input(s): LABPROT, INR in the last 72 hours.   Impression / Plan: This is a 46 y.o.female who presents for Colonoscopy for screening.  The risks and benefits of endoscopic evaluation/treatment were discussed with the patient and/or family; these include but are not limited to the risk of perforation, infection,  bleeding, missed lesions, lack of diagnosis, severe illness requiring hospitalization, as well as anesthesia and sedation related illnesses.  The patient's history has been reviewed, patient examined, no change in status, and deemed stable for procedure.  The patient and/or family is agreeable to proceed.    Justice Britain, MD [facility] Advanced Endoscopy Office # [phone] ]

## 2021-02-28 ENCOUNTER — Telehealth: Payer: Self-pay | Admitting: *Deleted

## 2021-02-28 NOTE — Telephone Encounter (Signed)
°  Follow up Call-  Call back number 02/26/2021  Post procedure Call Back phone  # [phone]  Permission to leave phone message Yes  Some recent data might be hidden     Patient questions:  Do you have a fever, pain , or abdominal swelling? No. Pain Score  0 *  Have you tolerated food without any problems? Yes.    Have you been able to return to your normal activities? Yes.    Do you have any questions about your discharge instructions: Diet   No. Medications  No. Follow up visit  No.  Do you have questions or concerns about your Care? No.  Actions: * If pain score is 4 or above: No action needed, pain <4.  Have you developed a fever since your procedure? no  2.   Have you had an respiratory symptoms (SOB or cough) since your procedure? no  3.   Have you tested positive for COVID 19 since your procedure no  4.   Have you had any family members/close contacts diagnosed with the COVID 19 since your procedure?  no   If yes to any of these questions please route to Joylene John, RN and Joella Prince, RN

## 2021-03-03 ENCOUNTER — Encounter: Payer: Self-pay | Admitting: Gastroenterology

## 2021-03-10 ENCOUNTER — Encounter: Payer: Self-pay | Admitting: Family Medicine

## 2021-03-10 DIAGNOSIS — Z6829 Body mass index (BMI) 29.0-29.9, adult: Secondary | ICD-10-CM

## 2021-03-11 NOTE — Telephone Encounter (Signed)
Orders Placed This Encounter  Procedures   Amb Ref to Medical Weight Management    Referral Priority:   Routine    Referral Type:   Consultation    Number of Visits Requested:   1

## 2021-03-22 IMAGING — DX DG FOOT COMPLETE 3+V*R*
3 series · 3 of 3 positions shown · non-contrast
Comparison: None.

CLINICAL DATA: Worsening lateral right foot pain No known injury.
For 1 year.

EXAM:
RIGHT FOOT COMPLETE - 3+ VIEW

[foot ap]
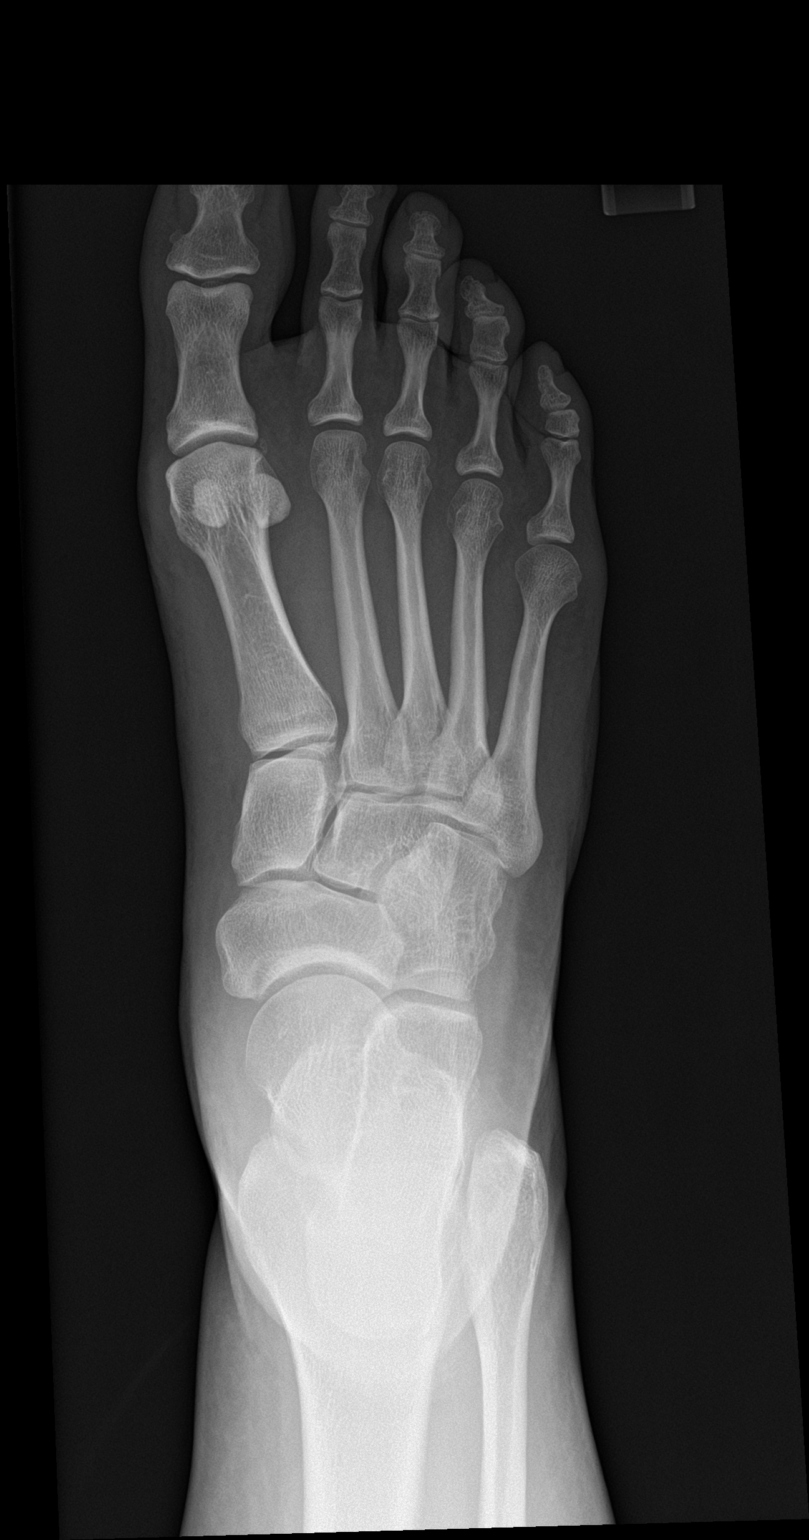

[foot obl]
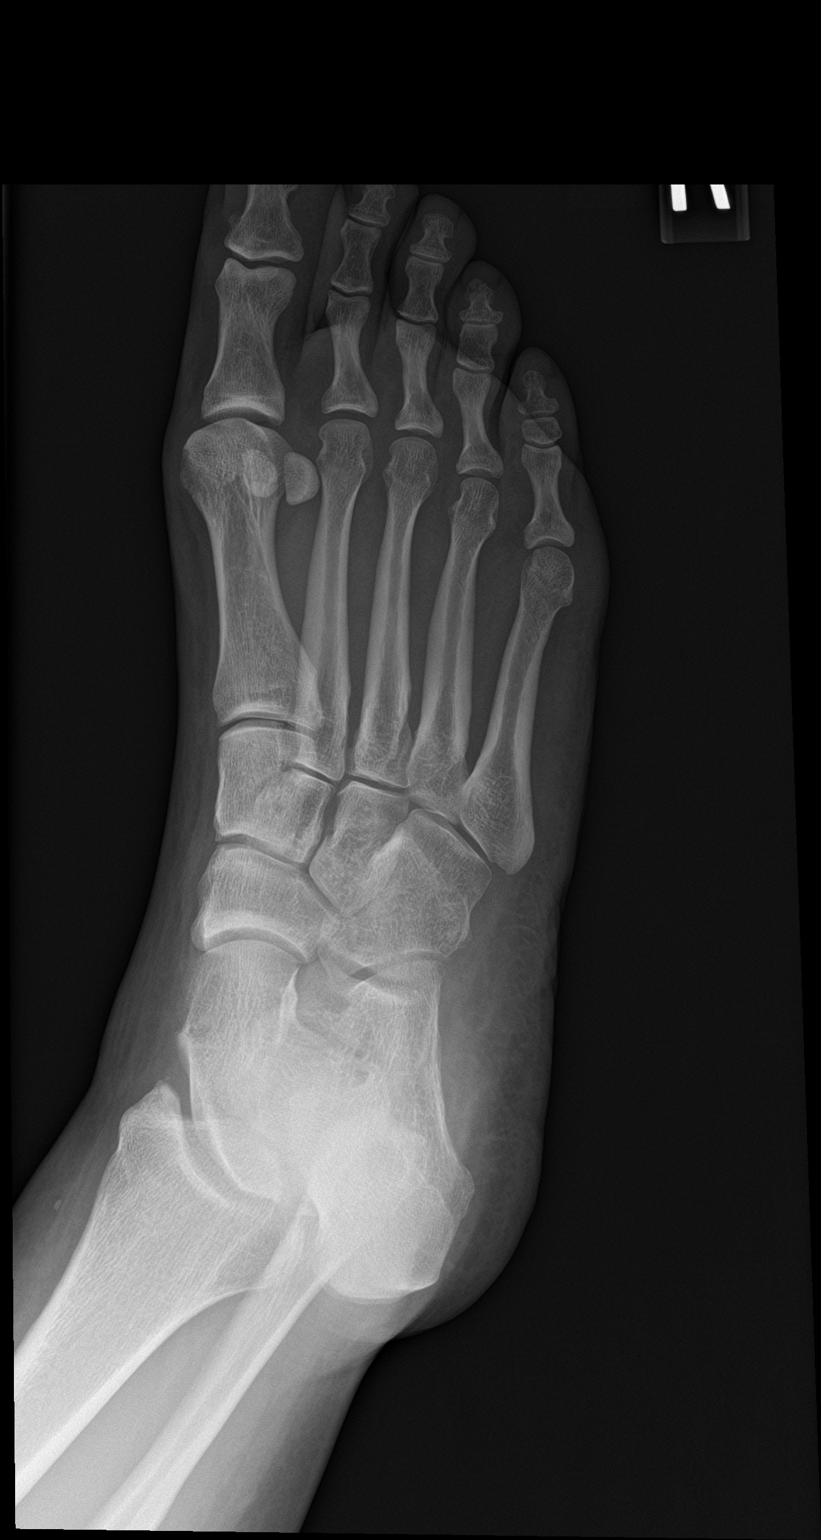

[foot lat]
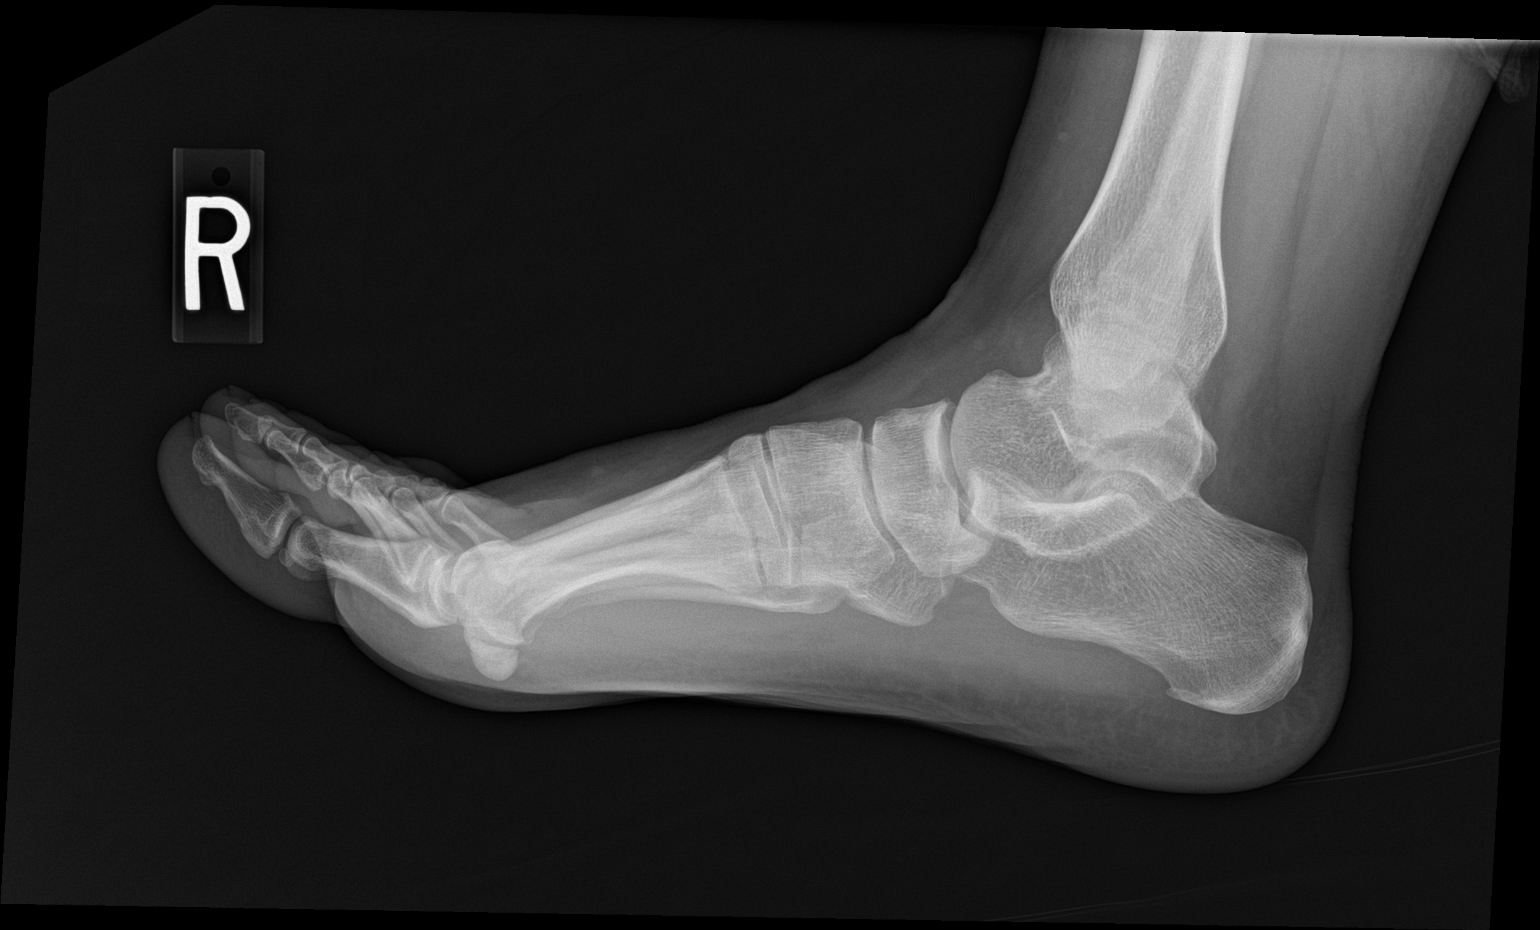

[3 of 3 positions shown; findings below may reference images not displayed]

FINDINGS: There is no evidence of fracture or dislocation. There is no
evidence of arthropathy or other focal bone abnormality. Soft
tissues are unremarkable.
IMPRESSION: Normal exam.

## 2021-10-06 ENCOUNTER — Telehealth: Payer: Self-pay | Admitting: *Deleted

## 2021-10-06 NOTE — Telephone Encounter (Signed)
Returned call from 11:29 AM. Voicemail is full.

## 2021-10-29 ENCOUNTER — Ambulatory Visit: Payer: BC Managed Care – PPO | Admitting: Obstetrics & Gynecology

## 2021-10-30 ENCOUNTER — Telehealth (INDEPENDENT_AMBULATORY_CARE_PROVIDER_SITE_OTHER): Payer: BC Managed Care – PPO | Admitting: Family Medicine

## 2021-10-30 DIAGNOSIS — U071 COVID-19: Secondary | ICD-10-CM | POA: Diagnosis not present

## 2021-10-30 DIAGNOSIS — Z0000 Encounter for general adult medical examination without abnormal findings: Secondary | ICD-10-CM

## 2021-10-30 MED ORDER — NIRMATRELVIR/RITONAVIR (PAXLOVID)TABLET: 3.0000 | ORAL_TABLET | Freq: Two times a day (BID) | ORAL | 0 refills | Status: AC

## 2021-10-30 NOTE — Progress Notes (Signed)
    Virtual Visit via Video Note  I connected with Jasmine Gomez on 10/30/21 at 10:50 AM EDT by a video enabled telemedicine application and verified that I am speaking with the correct person using two identifiers.   I discussed the limitations of evaluation and management by telemedicine and the availability of in person appointments. The patient expressed understanding and agreed to proceed.  Patient location: at home Provider location: in office  Subjective:    CC:  No chief complaint on file.   HPI: Tested pos for COVID on Tues Night. + runny nose, HA, and then started getting achy joints.  Fever 102 that night . Taking Dayquail. Pressure forehead, cheeks and back o fhead. Eyes are watering and sensitive to light. No GI sxs.    Past medical history, Surgical history, Family history not pertinant except as noted below, Social history, Allergies, and medications have been entered into the medical record, reviewed, and corrections made.    Objective:    General: Speaking clearly in complete sentences without any shortness of breath.  Alert and oriented x3.  Normal judgment. No apparent acute distress.    Impression and Recommendations:    Problem List Items Addressed This Visit   None Visit Diagnoses     COVID-19 virus infection    -  Primary   Relevant Medications   nirmatrelvir/ritonavir EUA (PAXLOVID) 20 x 150 MG & 10 x '100MG'$  TABS   Wellness examination       Relevant Orders   Lipid Panel w/reflex Direct LDL   COMPLETE METABOLIC PANEL WITH GFR   CBC   Hepatitis C Antibody       COVID 19 - will tx with Paxlovid. New rx sent.  Call if not better. Otc to continue symptomatic care. Work note entered into chart. No red flag sxs.   Labs ordered for appt later this month.   Orders Placed This Encounter  Procedures   Lipid Panel w/reflex Direct LDL   COMPLETE METABOLIC PANEL WITH GFR   CBC   Hepatitis C Antibody    Meds ordered this encounter  Medications    nirmatrelvir/ritonavir EUA (PAXLOVID) 20 x 150 MG & 10 x '100MG'$  TABS    Sig: Take 3 tablets by mouth 2 (two) times daily for 5 days. (Take nirmatrelvir 150 mg two tablets twice daily for 5 days and ritonavir 100 mg one tablet twice daily for 5 days) Patient GFR is 100    Dispense:  30 tablet    Refill:  0     I discussed the assessment and treatment plan with the patient. The patient was provided an opportunity to ask questions and all were answered. The patient agreed with the plan and demonstrated an understanding of the instructions.   The patient was advised to call back or seek an in-person evaluation if the symptoms worsen or if the condition fails to improve as anticipated.   Beatrice Lecher, MD

## 2021-11-17 ENCOUNTER — Encounter: Payer: Self-pay | Admitting: Family Medicine

## 2021-11-17 ENCOUNTER — Ambulatory Visit (INDEPENDENT_AMBULATORY_CARE_PROVIDER_SITE_OTHER): Payer: BC Managed Care – PPO | Admitting: Obstetrics & Gynecology

## 2021-11-17 ENCOUNTER — Other Ambulatory Visit (HOSPITAL_COMMUNITY)
Admission: RE | Admit: 2021-11-17 | Discharge: 2021-11-17 | Disposition: A | Discharge status: Home / Self Care | Payer: BC Managed Care – PPO | Source: Ambulatory Visit | Attending: Obstetrics & Gynecology | Admitting: Obstetrics & Gynecology

## 2021-11-17 ENCOUNTER — Encounter: Payer: Self-pay | Admitting: Obstetrics & Gynecology

## 2021-11-17 ENCOUNTER — Ambulatory Visit (INDEPENDENT_AMBULATORY_CARE_PROVIDER_SITE_OTHER): Payer: BC Managed Care – PPO | Admitting: Family Medicine

## 2021-11-17 VITALS — BP 115/77 | HR 81 | Ht 64.0 in | Wt 156.0 lb

## 2021-11-17 VITALS — BP 125/81 | HR 89 | Ht 64.0 in | Wt 156.0 lb

## 2021-11-17 DIAGNOSIS — Z0000 Encounter for general adult medical examination without abnormal findings: Secondary | ICD-10-CM | POA: Diagnosis not present

## 2021-11-17 DIAGNOSIS — Z23 Encounter for immunization: Secondary | ICD-10-CM | POA: Diagnosis not present

## 2021-11-17 DIAGNOSIS — Z01419 Encounter for gynecological examination (general) (routine) without abnormal findings: Secondary | ICD-10-CM

## 2021-11-17 DIAGNOSIS — R41840 Attention and concentration deficit: Secondary | ICD-10-CM

## 2021-11-17 DIAGNOSIS — N939 Abnormal uterine and vaginal bleeding, unspecified: Secondary | ICD-10-CM | POA: Diagnosis not present

## 2021-11-17 NOTE — Progress Notes (Signed)
Complete physical exam  Patient: Jasmine Gomez   DOB: December 31, 1975   46 y.o. Female  MRN: [id number]  Subjective:    Chief Complaint  Patient presents with   Annual Exam    Alecia Doi is a 46 y.o. female who presents today for a complete physical exam. She reports consuming a healthy diet.  Walking for exercise 5 days per week.   She generally feels well. She reports sleeping well. She does not have additional problems to discuss today.   She is been under a lot of stress.  Her mom has Alzheimer's and her sister is currently in the ICU after chemotherapy from breast cancer.  She just had a lot on her plate as well as just raising her kids.  She has been going [facility] and exercising regularly she is lost 21 pounds and done great.  She did speak with the therapist there and discussed her struggle with just feeling like it is difficult to keep everything completed and balanced.  She wonders if she could have ADD.  2 of her children have been diagnosed.  Most recent fall risk assessment:    11/17/2021    3:27 PM  [place] in the past year? 0  Number falls in past yr: 0  Injury with Fall? 0  Risk for fall due to : No Fall Risks  Follow up Falls evaluation completed     Most recent depression screenings:    11/17/2021    3:28 PM 11/14/2020    4:49 PM  PHQ 2/9 Scores  PHQ - 2 Score 1 0  PHQ- 9 Score 5 2         Patient Care Team: Hali Marry, MD as PCP - General (Family Medicine)   Outpatient Medications Prior to Visit  Medication Sig   ibuprofen (ADVIL) 200 MG tablet Take 200 mg by mouth every 6 (six) hours as needed.   No facility-administered medications prior to visit.    ROS        Objective:     BP 115/77   Pulse 81   Ht '5\' 4"'$  (1.626 m)   Wt 156 lb (70.8 kg)   SpO2 98%   BMI 26.78 kg/m     Physical Exam Vitals and nursing note reviewed.  Constitutional:      Appearance: She is well-developed.  HENT:     Head:  Normocephalic and atraumatic.     Right Ear: External ear normal.     Left Ear: External ear normal.     Nose: Nose normal.  Eyes:     Conjunctiva/sclera: Conjunctivae normal.     Pupils: Pupils are equal, round, and reactive to light.  Neck:     Thyroid: No thyromegaly.  Cardiovascular:     Rate and Rhythm: Normal rate and regular rhythm.     Heart sounds: Normal heart sounds.  Pulmonary:     Effort: Pulmonary effort is normal.     Breath sounds: Normal breath sounds. No wheezing.  Abdominal:     General: Bowel sounds are normal.     Palpations: Abdomen is soft.  Musculoskeletal:     Cervical back: Neck supple.  Lymphadenopathy:     Cervical: No cervical adenopathy.  Skin:    General: Skin is warm and dry.     Coloration: Skin is not pale.  Neurological:     Mental Status: She is alert and oriented to person, place, and time.  Psychiatric:  Mood and Affect: Mood normal.        Behavior: Behavior normal.      No results found for any visits on 11/17/21.      Assessment & Plan:    Routine Health Maintenance and Physical Exam  Immunization History  Administered Date(s) Administered   Influenza Nasal 10/23/2014   Influenza Split 11/16/2011   Influenza,inj,Quad PF,6+ Mos 10/19/2016, 09/09/2017, 10/20/2018, 11/09/2019, 11/17/2021   Influenza-Unspecified 12/26/2012   PFIZER(Purple Top)SARS-COV-2 Vaccination 03/25/2019, 04/17/2019, 04/26/2019, 10/30/2019   PPD Test 09/05/2010   Tdap 09/05/2010, 06/01/2013    Health Maintenance  Topic Date Due   Hepatitis C Screening  Never done   COVID-19 Vaccine (5 - Pfizer risk series) 12/25/2019   PAP SMEAR-Modifier  03/15/2022   TETANUS/TDAP  06/02/2023   COLONOSCOPY (Pts 45-61yr Insurance coverage will need to be confirmed)  02/27/2031   INFLUENZA VACCINE  Completed   HIV Screening  Completed   Pneumococcal Vaccine 17265Years old  Aged Out   HPV VACCINES  Aged Out    Discussed health benefits of physical activity,  and encouraged her to engage in regular exercise appropriate for her age and condition.  Problem List Items Addressed This Visit   None Visit Diagnoses     Wellness examination    -  Primary   Need for immunization against influenza       Relevant Orders   Flu Vaccine QUAD 652moM (Fluarix, Fluzone & Alfiuria Quad PF) (Completed)   Inattention           Keep up a regular exercise program and make sure you are eating a healthy diet Try to eat 4 servings of dairy a day, or if you are lactose intolerant take a calcium with vitamin D daily.  Your vaccines are up to date.   No follow-ups on file.     CaBeatrice LecherMD

## 2021-11-17 NOTE — Patient Instructions (Signed)
Francie Massing with [facility]

## 2021-11-17 NOTE — Progress Notes (Signed)
Last Mammogram: 01/01/21- normal Last Pap Smear:  03/15/17- negative Last Colon Screening;  2022- normal Seat Belts:   Yes Sun Screen:   Yes Dental Check Up:  Yes Brush & Floss:  Yes

## 2021-11-17 NOTE — Progress Notes (Signed)
Subjective:     Jasmine Gomez is a 46 y.o. female here for a routine exam.  Current complaints: Patient had another episode of postmenopausal bleeding.  She has had endometrial biopsy and transvaginal ultrasound the past.  Interestingly her [place] went from 76 4 years ago to 15 two years ago.  Patient's husband has a vasectomy and pregnancy is not a concern at this time.   Gynecologic History No LMP recorded. (Menstrual status: Perimenopausal). Contraception: vasectomy Last Mammogram: 01/01/21- normal Last Pap Smear:  03/15/17- negative Last Colon Screening;  2022- normal Seat Belts:   Yes Sun Screen:   Yes Dental Check Up:  Yes Brush & Floss:  Yes   Obstetric History OB History  Gravida Para Term Preterm AB Living  _0 SAB IAB Ectopic Multiple Live Births          3    # Outcome Date GA Lbr Len/2nd Weight Sex Delivery Anes PTL Lv  3 Term 08/14/13 [id number]:26 / 00:22 8 lb 8.9 oz (3.881 kg) M Vag-Spont EPI  LIV  2 Term 08/26/07 374w0d8 lb 6 oz (3.799 kg) M Vag-Spont EPI N LIV  1 Term 09/18/05 [id number] lb 2 oz (3.685 kg) M Vag-Spont EPI N LIV     The following portions of the patient's history were reviewed and updated as appropriate: allergies, current medications, past family history, past medical history, past social history, past surgical history, and problem list.  Review of Systems Pertinent items noted in HPI and remainder of comprehensive ROS otherwise negative.    Objective:     Vitals:   11/17/21 1345  BP: 125/81  Pulse: 89  Weight: 156 lb (70.8 kg)  Height: _1  (1.626 m)   Vitals:  WNL General appearance: alert, cooperative and no distress  HEENT: Normocephalic, without obvious abnormality, atraumatic Eyes: negative Throat: lips, mucosa, and tongue normal; teeth and gums normal  Respiratory: Clear to auscultation bilaterally  CV: Regular rate and rhythm  Breasts:  Normal appearance, no masses or tenderness, no nipple retraction or dimpling  GI:  Soft, non-tender; bowel sounds normal; no masses,  no organomegaly  GU: External Genitalia:  Tanner V, no lesion Urethra:  No prolapse   Vagina: Pink, normal rugae, no blood or discharge  Cervix: No CMT, no lesion  Uterus:  Normal size and contour, non tender  Adnexa: Normal, no masses, non tender  Musculoskeletal: No edema, redness or tenderness in the calves or thighs  Skin: No lesions or rash  Lymphatic: Axillary adenopathy: none     Psychiatric: Normal mood and behavior        Assessment:    Healthy female exam.    Plan:  Check FSH.  If it is low again, we will watch her menstruation pattern.  If she in menopausal range then I would suggest ultrasound and/or endometrial biopsy Pap smear today Yearly mammograms Colon screening up-to-date She has her yearly exam with Dr. MetMadilyn Firemanday Sister who survived breast cancer was recently diagnosed with osteosarcoma and had a lower leg amputation and just finished chemotherapy.  JenOtha BRCA negative while her sister is positive.

## 2021-11-18 LAB — CYTOLOGY - PAP
Comment: NEGATIVE
Diagnosis: NEGATIVE
High risk HPV: NEGATIVE

## 2021-11-18 NOTE — Progress Notes (Signed)
Jasmine Gomez can we add the [facility] to her labs. Thank you!!!!!!

## 2021-11-18 NOTE — Progress Notes (Signed)
Hi Roopa, your LDL cholesterol is elevated, similar to past years. Though it has been trending down which is great!   Your overall risk for heart disease is still low so we don't need to start medication.  Just continue to work on diet and exercise. All other labs look great!!  The 10-year ASCVD risk score (Arnett DK, et al., 2019) is: 1.4%   Values used to calculate the score:     Age: 46 years     Sex: Female     Is Non-Hispanic African American: No     Diabetic: No     Tobacco smoker: No     Systolic Blood Pressure: 893 mmHg     Is BP treated: No     HDL Cholesterol: 43 mg/dL     Total Cholesterol: 245 mg/dL

## 2021-11-19 LAB — CBC
HCT: 40.2 % (ref 35.0–45.0)
Hemoglobin: 13.8 g/dL (ref 11.7–15.5)
MCH: 31.3 pg (ref 27.0–33.0)
MCHC: 34.3 g/dL (ref 32.0–36.0)
MCV: 91.2 fL (ref 80.0–100.0)
MPV: 10 fL (ref 7.5–12.5)
Platelets: 343 10*3/uL (ref 140–400)
RBC: 4.41 10*6/uL (ref 3.80–5.10)
RDW: 11.9 % (ref 11.0–15.0)
WBC: 5.9 10*3/uL (ref 3.8–10.8)

## 2021-11-19 LAB — LIPID PANEL W/REFLEX DIRECT LDL
Cholesterol: 245 mg/dL — ABNORMAL HIGH (ref <200)
HDL: 43 mg/dL — ABNORMAL LOW (ref >=50)
LDL Cholesterol (Calc): 169 mg/dL (calc) — ABNORMAL HIGH
Non-HDL Cholesterol (Calc): 202 mg/dL (calc) — ABNORMAL HIGH (ref <130)
Total CHOL/HDL Ratio: 5.7 (calc) — ABNORMAL HIGH (ref <5.0)
Triglycerides: 173 mg/dL — ABNORMAL HIGH (ref <150)

## 2021-11-19 LAB — HEPATITIS C ANTIBODY: Hepatitis C Ab: NONREACTIVE

## 2021-11-19 LAB — COMPLETE METABOLIC PANEL WITH GFR
AG Ratio: 2 (calc) (ref 1.0–2.5)
ALT: 13 U/L (ref 6–29)
AST: 13 U/L (ref 10–35)
Albumin: 4.5 g/dL (ref 3.6–5.1)
Alkaline phosphatase (APISO): 46 U/L (ref 31–125)
BUN: 18 mg/dL (ref 7–25)
CO2: 32 mmol/L (ref 20–32)
Calcium: 9.7 mg/dL (ref 8.6–10.2)
Chloride: 104 mmol/L (ref 98–110)
Creat: 0.68 mg/dL (ref 0.50–0.99)
Globulin: 2.3 g/dL (calc) (ref 1.9–3.7)
Glucose, Bld: 96 mg/dL (ref 65–99)
Potassium: 4.4 mmol/L (ref 3.5–5.3)
Sodium: 141 mmol/L (ref 135–146)
Total Bilirubin: 0.6 mg/dL (ref 0.2–1.2)
Total Protein: 6.8 g/dL (ref 6.1–8.1)
eGFR: 109 mL/min/{1.73_m2} (ref >=60)

## 2021-11-19 LAB — FOLLICLE STIMULATING HORMONE: FSH: 54 m[IU]/mL

## 2021-11-19 LAB — TSH: TSH: 1.83 mIU/L

## 2021-11-19 NOTE — Progress Notes (Signed)
Hi Solash, it looks like the [facility] is in the postmenopausal range.  Some good to forward this to Dr. Gala Romney so she can decide what she would like to do next.

## 2021-11-22 ENCOUNTER — Encounter: Payer: Self-pay | Admitting: Obstetrics & Gynecology

## 2021-11-23 ENCOUNTER — Other Ambulatory Visit: Payer: Self-pay | Admitting: Obstetrics & Gynecology

## 2021-11-23 DIAGNOSIS — N950 Postmenopausal bleeding: Secondary | ICD-10-CM

## 2021-11-23 NOTE — Progress Notes (Signed)
Pelvis US complete with tvus for PMB

## 2021-11-25 ENCOUNTER — Encounter: Payer: Self-pay | Admitting: Family Medicine

## 2021-11-25 DIAGNOSIS — R41840 Attention and concentration deficit: Secondary | ICD-10-CM

## 2021-11-26 ENCOUNTER — Ambulatory Visit (INDEPENDENT_AMBULATORY_CARE_PROVIDER_SITE_OTHER): Payer: BC Managed Care – PPO

## 2021-11-26 DIAGNOSIS — N950 Postmenopausal bleeding: Secondary | ICD-10-CM

## 2022-01-08 ENCOUNTER — Ambulatory Visit: Payer: BC Managed Care – PPO

## 2022-01-14 ENCOUNTER — Ambulatory Visit: Payer: BC Managed Care – PPO

## 2022-02-04 ENCOUNTER — Ambulatory Visit: Payer: BC Managed Care – PPO

## 2022-02-25 ENCOUNTER — Ambulatory Visit (INDEPENDENT_AMBULATORY_CARE_PROVIDER_SITE_OTHER): Payer: BC Managed Care – PPO

## 2022-02-25 DIAGNOSIS — Z1231 Encounter for screening mammogram for malignant neoplasm of breast: Secondary | ICD-10-CM | POA: Diagnosis not present

## 2022-02-25 DIAGNOSIS — Z01419 Encounter for gynecological examination (general) (routine) without abnormal findings: Secondary | ICD-10-CM

## 2022-05-29 ENCOUNTER — Encounter: Payer: Self-pay | Admitting: Family Medicine

## 2022-05-29 NOTE — Telephone Encounter (Signed)
PLease call [facility] and get them to fax over her ADD evaluation.  We had referred her there several months ago but have not received a copy of her report to scanned into her chart.

## 2022-06-01 NOTE — Telephone Encounter (Signed)
Left voicemail for they to fax over the office note.

## 2022-06-04 NOTE — Telephone Encounter (Signed)
Haven't received them yet, just checked.

## 2022-06-05 NOTE — Telephone Encounter (Signed)
Left another message for their office to fax [place] the assessment/office notes.

## 2022-06-10 NOTE — Telephone Encounter (Signed)
Still don't have it

## 2022-06-10 NOTE — Telephone Encounter (Signed)
I called and left a message again today.  [facility] Address: [address] [place], [place], [place] [address] Open ? Closes 5?PM Phone: [phone]

## 2022-06-12 NOTE — Telephone Encounter (Signed)
Left another message today

## 2022-06-15 ENCOUNTER — Encounter: Payer: Self-pay | Admitting: Family Medicine

## 2022-06-15 DIAGNOSIS — F909 Attention-deficit hyperactivity disorder, unspecified type: Secondary | ICD-10-CM | POA: Insufficient documentation

## 2022-06-19 ENCOUNTER — Encounter: Payer: Self-pay | Admitting: Family Medicine

## 2022-06-19 NOTE — Telephone Encounter (Signed)
Added to medication list

## 2022-06-30 ENCOUNTER — Encounter: Payer: Self-pay | Admitting: Family Medicine

## 2022-06-30 DIAGNOSIS — F902 Attention-deficit hyperactivity disorder, combined type: Secondary | ICD-10-CM

## 2022-07-01 MED ORDER — METHYLPHENIDATE HCL ER (OSM) 36 MG PO TBCR
36.0000 mg | EXTENDED_RELEASE_TABLET | Freq: Every day | ORAL | 0 refills | Status: DC
Start: 2022-08-29 — End: 2022-10-09

## 2022-07-01 MED ORDER — METHYLPHENIDATE HCL ER (OSM) 36 MG PO TBCR: 36.0000 mg | EXTENDED_RELEASE_TABLET | Freq: Every day | ORAL | 0 refills | Status: DC

## 2022-08-07 ENCOUNTER — Other Ambulatory Visit: Payer: Self-pay | Admitting: Family Medicine

## 2022-08-07 DIAGNOSIS — F902 Attention-deficit hyperactivity disorder, combined type: Secondary | ICD-10-CM

## 2022-10-09 ENCOUNTER — Ambulatory Visit: Payer: BC Managed Care – PPO | Admitting: Family Medicine

## 2022-10-09 ENCOUNTER — Encounter: Payer: Self-pay | Admitting: Family Medicine

## 2022-10-09 VITALS — BP 123/84 | HR 72 | Temp 98.2°F | Ht 64.0 in | Wt 166.0 lb

## 2022-10-09 DIAGNOSIS — F902 Attention-deficit hyperactivity disorder, combined type: Secondary | ICD-10-CM

## 2022-10-09 DIAGNOSIS — Z23 Encounter for immunization: Secondary | ICD-10-CM | POA: Diagnosis not present

## 2022-10-09 MED ORDER — METHYLPHENIDATE HCL ER (OSM) 36 MG PO TBCR: 36.0000 mg | EXTENDED_RELEASE_TABLET | Freq: Every day | ORAL | 0 refills | Status: DC

## 2022-10-09 NOTE — Assessment & Plan Note (Addendum)
BP at goal. No concerning S.E.  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

## 2022-10-09 NOTE — Progress Notes (Signed)
Established Patient Office Visit  Subjective   Patient ID: Jasmine Gomez, female    DOB: [birth year]  Age: 47 y.o. MRN: [id number]  Chief Complaint  Patient presents with   Medical Management of Chronic Issues          HPI  ADD - Reports symptoms are well controlled on current regimen. Denies any problems with insomnia, chest pain, palpitations, or SOB.    Work is going okay in general.    ROS    Objective:     BP 123/84   Pulse 72   Temp 98.2 F (36.8 C)   Ht 5\' 4"  (1.626 m)   Wt 166 lb (75.3 kg)   SpO2 100%   BMI 28.49 kg/m    Physical Exam Vitals and nursing note reviewed.  Constitutional:      Appearance: Normal appearance.  HENT:     Head: Normocephalic and atraumatic.  Eyes:     Conjunctiva/sclera: Conjunctivae normal.  Cardiovascular:     Rate and Rhythm: Normal rate and regular rhythm.  Pulmonary:     Effort: Pulmonary effort is normal.     Breath sounds: Normal breath sounds.  Skin:    General: Skin is warm and dry.  Neurological:     Mental Status: She is alert.  Psychiatric:        Mood and Affect: Mood normal.     No results found for any visits on 10/09/22.    The 10-year ASCVD risk score (Arnett DK, et al., 2019) is: 1.7%    Assessment & Plan:   Problem List Items Addressed This Visit       Other   ADHD    BP at goal. No concerning S.E.  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    Nani Gasser, MD

## 2022-11-19 ENCOUNTER — Other Ambulatory Visit: Payer: Self-pay | Admitting: *Deleted

## 2022-11-19 DIAGNOSIS — Z0000 Encounter for general adult medical examination without abnormal findings: Secondary | ICD-10-CM

## 2022-11-20 ENCOUNTER — Other Ambulatory Visit: Payer: Self-pay | Admitting: Medical Genetics

## 2022-11-20 ENCOUNTER — Ambulatory Visit (INDEPENDENT_AMBULATORY_CARE_PROVIDER_SITE_OTHER): Payer: BC Managed Care – PPO | Admitting: Family Medicine

## 2022-11-20 ENCOUNTER — Encounter: Payer: Self-pay | Admitting: Family Medicine

## 2022-11-20 ENCOUNTER — Other Ambulatory Visit: Payer: Self-pay | Admitting: Family Medicine

## 2022-11-20 VITALS — BP 122/80 | HR 90 | Ht 64.0 in | Wt 168.5 lb

## 2022-11-20 DIAGNOSIS — Z0000 Encounter for general adult medical examination without abnormal findings: Secondary | ICD-10-CM | POA: Diagnosis not present

## 2022-11-20 DIAGNOSIS — M722 Plantar fascial fibromatosis: Secondary | ICD-10-CM | POA: Diagnosis not present

## 2022-11-20 DIAGNOSIS — F902 Attention-deficit hyperactivity disorder, combined type: Secondary | ICD-10-CM | POA: Diagnosis not present

## 2022-11-20 DIAGNOSIS — E785 Hyperlipidemia, unspecified: Secondary | ICD-10-CM

## 2022-11-20 DIAGNOSIS — Z006 Encounter for examination for normal comparison and control in clinical research program: Secondary | ICD-10-CM

## 2022-11-20 LAB — CMP14+EGFR
ALT: 20 [IU]/L (ref 0–32)
AST: 16 [IU]/L (ref 0–40)
Albumin: 4.5 g/dL (ref 3.9–4.9)
Alkaline Phosphatase: 63 [IU]/L (ref 44–121)
BUN/Creatinine Ratio: 20 (ref 9–23)
BUN: 16 mg/dL (ref 6–24)
Bilirubin Total: 0.5 mg/dL (ref 0.0–1.2)
CO2: 22 mmol/L (ref 20–29)
Calcium: 9.9 mg/dL (ref 8.7–10.2)
Chloride: 102 mmol/L (ref 96–106)
Creatinine, Ser: 0.79 mg/dL (ref 0.57–1.00)
Globulin, Total: 2.4 g/dL (ref 1.5–4.5)
Glucose: 89 mg/dL (ref 70–99)
Potassium: 4.4 mmol/L (ref 3.5–5.2)
Sodium: 140 mmol/L (ref 134–144)
Total Protein: 6.9 g/dL (ref 6.0–8.5)
eGFR: 93 mL/min/{1.73_m2} (ref >59)

## 2022-11-20 LAB — CBC
Hematocrit: 42.2 % (ref 34.0–46.6)
Hemoglobin: 14 g/dL (ref 11.1–15.9)
MCH: 31.4 pg (ref 26.6–33.0)
MCHC: 33.2 g/dL (ref 31.5–35.7)
MCV: 95 fL (ref 79–97)
Platelets: 311 10*3/uL (ref 150–450)
RBC: 4.46 x10E6/uL (ref 3.77–5.28)
RDW: 12.3 % (ref 11.7–15.4)
WBC: 5.7 10*3/uL (ref 3.4–10.8)

## 2022-11-20 LAB — LIPID PANEL WITH LDL/HDL RATIO
Cholesterol, Total: 290 mg/dL — ABNORMAL HIGH (ref 100–199)
HDL: 49 mg/dL (ref >39)
LDL Chol Calc (NIH): 195 mg/dL — ABNORMAL HIGH (ref 0–99)
LDL/HDL Ratio: 4 ratio — ABNORMAL HIGH (ref 0.0–3.2)
Triglycerides: 237 mg/dL — ABNORMAL HIGH (ref 0–149)
VLDL Cholesterol Cal: 46 mg/dL — ABNORMAL HIGH (ref 5–40)

## 2022-11-20 LAB — TSH: TSH: 1.84 u[IU]/mL (ref 0.450–4.500)

## 2022-11-20 MED ORDER — METHYLPHENIDATE HCL ER (OSM) 36 MG PO TBCR: 36.0000 mg | EXTENDED_RELEASE_TABLET | Freq: Every day | ORAL | 0 refills | Status: DC

## 2022-11-20 NOTE — Assessment & Plan Note (Signed)
Well with current medication regimen.  She did pick up the October prescription.  November is still on file.  Went ahead and sent next prescriptions for December and January.  Follow-up in 3 months.

## 2022-11-20 NOTE — Progress Notes (Signed)
Hi Jasmine Gomez,  Your LDL cholesterol is really high.  Your triglycerides are way up as well.  I would really like to do an extra test for familial hypercholesterolemia.  This is more of a genetic trait.  It is extra blood work if that something you would feel comfortable with I can order it and you can come at your convenience. All other labs look great.

## 2022-11-20 NOTE — Progress Notes (Signed)
Complete physical exam  Patient: Jasmine Gomez   DOB: 11/27/1975   47 y.o. Female  MRN: [id number]  Subjective:    Chief Complaint  Patient presents with   Annual Exam    Pt states she is having feet and neck pain    Jasmine Gomez is a 47 y.o. female who presents today for a complete physical exam. She reports consuming a general diet.  Walking for exercise  She generally feels fairly well.  She does not have additional problems to discuss today.   She also reports bilateral heel pain.  It is worse when she first steps down on her feet in the mornings when she gets out of bed and usually feels better when she is walking a little bit more.  She is been try to do a few stretches at home.  She does walk in the evenings for exercise.  No injury or trauma.  Occasionally it will radiate into the arch but most commonly is at the bottom of the heel.  Interestingly her sister actually had osteosarcoma of the heel that was treated as plantar fasciitis for most a year.  Most recent fall risk assessment:    11/20/2022    1:29 PM  Fall Risk   Falls in the past year? 0  Injury with Fall? 0  Risk for fall due to : No Fall Risks  Follow up Falls evaluation completed     Most recent depression screenings:    11/20/2022    1:29 PM 11/17/2021    3:28 PM  PHQ 2/9 Scores  PHQ - 2 Score 0 1  PHQ- 9 Score  5         Patient Care Team: Agapito Games, MD as PCP - General (Family Medicine)   Outpatient Medications Prior to Visit  Medication Sig   ibuprofen (ADVIL) 200 MG tablet Take 200 mg by mouth as needed.   [DISCONTINUED] methylphenidate 36 MG PO CR tablet Take 1 tablet (36 mg total) by mouth daily.   [DISCONTINUED] methylphenidate 36 MG PO CR tablet Take 1 tablet (36 mg total) by mouth daily.   [START ON 12/06/2022] methylphenidate 36 MG PO CR tablet Take 1 tablet (36 mg total) by mouth daily.   No facility-administered medications prior to visit.    ROS         Objective:     BP 122/80 (BP Location: Left Arm, Patient Position: Sitting, Cuff Size: Normal)   Pulse 90   Ht 5\' 4"  (1.626 m)   Wt 168 lb 8 oz (76.4 kg)   SpO2 94%   BMI 28.92 kg/m     Physical Exam Constitutional:      Appearance: Normal appearance.  HENT:     Head: Normocephalic and atraumatic.     Right Ear: Tympanic membrane, ear canal and external ear normal. There is no impacted cerumen.     Left Ear: Tympanic membrane, ear canal and external ear normal. There is no impacted cerumen.     Nose: Nose normal.     Mouth/Throat:     Pharynx: Oropharynx is clear.  Eyes:     Extraocular Movements: Extraocular movements intact.     Conjunctiva/sclera: Conjunctivae normal.     Pupils: Pupils are equal, round, and reactive to light.  Neck:     Thyroid: No thyromegaly.  Cardiovascular:     Rate and Rhythm: Normal rate and regular rhythm.  Pulmonary:     Effort: Pulmonary effort is normal.  Breath sounds: Normal breath sounds.  Abdominal:     General: Bowel sounds are normal.     Palpations: Abdomen is soft.     Tenderness: There is no abdominal tenderness.  Musculoskeletal:        General: No swelling.     Cervical back: Neck supple. No tenderness.     Comments: Tender over the base of the heels bilaterally.  Some discomfort with dorsiflexion.  Dorsal pedal pulses 2+.  Lymphadenopathy:     Cervical: No cervical adenopathy.  Skin:    General: Skin is warm and dry.  Neurological:     Mental Status: She is alert and oriented to person, place, and time.  Psychiatric:        Mood and Affect: Mood normal.        Behavior: Behavior normal.      No results found for any visits on 11/20/22.      Assessment & Plan:    Routine Health Maintenance and Physical Exam  Immunization History  Administered Date(s) Administered   Influenza Nasal 10/23/2014   Influenza Split 11/16/2011   Influenza, Seasonal, Injecte, Preservative Fre 10/09/2022   Influenza,inj,Quad PF,6+  Mos 10/19/2016, 09/09/2017, 10/20/2018, 11/09/2019, 11/17/2021   Influenza-Unspecified 12/26/2012   PFIZER(Purple Top)SARS-COV-2 Vaccination 03/25/2019, 04/17/2019, 04/26/2019, 10/30/2019   PPD Test 09/05/2010   Pfizer(Comirnaty)Fall Seasonal Vaccine 12 years and older 10/09/2022   Tdap 09/05/2010, 06/01/2013    Health Maintenance  Topic Date Due   COVID-19 Vaccine (6 - 2023-24 season) 12/04/2022   DTaP/Tdap/Td (3 - Td or Tdap) 06/02/2023   Cervical Cancer Screening (HPV/Pap Cotest)  11/18/2026   Colonoscopy  02/27/2031   INFLUENZA VACCINE  Completed   Hepatitis C Screening  Completed   HIV Screening  Completed   Pneumococcal Vaccine 85-60 Years old  Aged Out   HPV VACCINES  Aged Out    Discussed health benefits of physical activity, and encouraged her to engage in regular exercise appropriate for her age and condition.  Problem List Items Addressed This Visit       Musculoskeletal and Integument   Plantar fasciitis     Other   ADHD    Well with current medication regimen.  She did pick up the October prescription.  November is still on file.  Went ahead and sent next prescriptions for December and January.  Follow-up in 3 months.      Relevant Medications   methylphenidate 36 MG PO CR tablet (Start on 01/04/2023)   methylphenidate 36 MG PO CR tablet (Start on 02/03/2023)   Other Visit Diagnoses     Wellness examination    -  Primary     Keep up a regular exercise program and make sure you are eating a healthy diet Try to eat 4 servings of dairy a day, or if you are lactose intolerant take a calcium with vitamin D daily.  Your vaccines are up to date.   Plantar fasciitis-diagnosis discussed.  Information provided on stretches via handout.  Also consider night splints if pain continues.  Avoid going barefoot.  Might want to look at getting some special insoles for her shoes.  Can also consider injections if not improving.  Return in about 3 months (around 02/20/2023) for  ADD.     Nani Gasser, MD

## 2022-11-24 ENCOUNTER — Encounter: Payer: Self-pay | Admitting: Family Medicine

## 2022-12-15 ENCOUNTER — Other Ambulatory Visit (HOSPITAL_COMMUNITY)
Admission: RE | Admit: 2022-12-15 | Discharge: 2022-12-15 | Disposition: A | Discharge status: Home / Self Care | Payer: Self-pay | Source: Ambulatory Visit | Attending: Oncology | Admitting: Oncology

## 2022-12-15 DIAGNOSIS — Z006 Encounter for examination for normal comparison and control in clinical research program: Secondary | ICD-10-CM | POA: Insufficient documentation

## 2022-12-29 LAB — GENECONNECT MOLECULAR SCREEN: Genetic Analysis Overall Interpretation: NEGATIVE

## 2022-12-30 ENCOUNTER — Encounter: Payer: Self-pay | Admitting: Family Medicine

## 2022-12-30 DIAGNOSIS — E785 Hyperlipidemia, unspecified: Secondary | ICD-10-CM

## 2022-12-30 NOTE — Telephone Encounter (Signed)
The 10-year ASCVD risk score (Arnett DK, et al., 2019) is: 1.8%   Values used to calculate the score:     Age: 47 years     Sex: Female     Is Non-Hispanic African American: No     Diabetic: No     Tobacco smoker: No     Systolic Blood Pressure: 122 mmHg     Is BP treated: No     HDL Cholesterol: 49 mg/dL     Total Cholesterol: 290 mg/dL

## 2022-12-30 NOTE — Addendum Note (Signed)
Addended by: Nani Gasser D on: 12/30/2022 01:30 PM   Modules accepted: Orders

## 2023-01-15 ENCOUNTER — Ambulatory Visit (INDEPENDENT_AMBULATORY_CARE_PROVIDER_SITE_OTHER): Payer: Self-pay

## 2023-01-15 DIAGNOSIS — E785 Hyperlipidemia, unspecified: Secondary | ICD-10-CM

## 2023-01-18 ENCOUNTER — Encounter: Payer: Self-pay | Admitting: Family Medicine

## 2023-01-18 DIAGNOSIS — E785 Hyperlipidemia, unspecified: Secondary | ICD-10-CM

## 2023-01-18 DIAGNOSIS — R931 Abnormal findings on diagnostic imaging of heart and coronary circulation: Secondary | ICD-10-CM

## 2023-01-18 MED ORDER — ROSUVASTATIN CALCIUM 10 MG PO TABS: 10.0000 mg | ORAL_TABLET | Freq: Every day | ORAL | 3 refills | Status: DC

## 2023-01-18 NOTE — Progress Notes (Signed)
Hi Jasmine Gomez, the cardiac CT score showed that your calcium score was 5.55.  This is actually in the 77th percentile for age race and sex matched controls.  This is a significant enough risk at that I would recommend initiating statin reduction therapy if you are not planning on having anymore children at this point.  I would also like to send you for consultation with a cardiologist just to make sure there is nothing else that we need to be addressing more closely because of this elevated score.  If you are okay with statin referral then please let me know.  That can cause increased risk of muscle aches and pains typically its diffuse when it happens and we can always address it if it occurs it is not permanent.  I would at least recommend a trial of the medication and see how it goes.  We can always start low and then adjust your dose if needed.

## 2023-01-18 NOTE — Telephone Encounter (Signed)
Orders Placed This Encounter  Procedures   Ambulatory referral to Cardiology    Referral Priority:   Routine    Referral Type:   Consultation    Referral Reason:   Specialty Services Required    Number of Visits Requested:   1   Meds ordered this encounter  Medications   rosuvastatin (CRESTOR) 10 MG tablet    Sig: Take 1 tablet (10 mg total) by mouth at bedtime.    Dispense:  90 tablet    Refill:  3

## 2023-01-29 ENCOUNTER — Encounter: Payer: Self-pay | Admitting: Family Medicine

## 2023-03-01 ENCOUNTER — Ambulatory Visit (INDEPENDENT_AMBULATORY_CARE_PROVIDER_SITE_OTHER): Payer: 59 | Admitting: Family Medicine

## 2023-03-01 ENCOUNTER — Ambulatory Visit: Payer: 59 | Admitting: Family Medicine

## 2023-03-01 ENCOUNTER — Encounter: Payer: Self-pay | Admitting: Family Medicine

## 2023-03-01 ENCOUNTER — Ambulatory Visit: Payer: Self-pay | Admitting: Family Medicine

## 2023-03-01 VITALS — BP 132/90 | HR 79 | Ht 64.0 in | Wt 170.8 lb

## 2023-03-01 DIAGNOSIS — B9689 Other specified bacterial agents as the cause of diseases classified elsewhere: Secondary | ICD-10-CM | POA: Diagnosis not present

## 2023-03-01 DIAGNOSIS — R6889 Other general symptoms and signs: Secondary | ICD-10-CM

## 2023-03-01 DIAGNOSIS — R051 Acute cough: Secondary | ICD-10-CM | POA: Diagnosis not present

## 2023-03-01 DIAGNOSIS — J019 Acute sinusitis, unspecified: Secondary | ICD-10-CM

## 2023-03-01 LAB — POCT INFLUENZA A/B
Influenza A, POC: NEGATIVE
Influenza B, POC: NEGATIVE

## 2023-03-01 MED ORDER — AMOXICILLIN-POT CLAVULANATE 875-125 MG PO TABS: 1.0000 | ORAL_TABLET | Freq: Two times a day (BID) | ORAL | 0 refills | Status: AC

## 2023-03-01 MED ORDER — HYDROCOD POLI-CHLORPHE POLI ER 10-8 MG/5ML PO SUER: 5.0000 mL | Freq: Two times a day (BID) | ORAL | 0 refills | Status: DC | PRN

## 2023-03-01 NOTE — Assessment & Plan Note (Signed)
Pt notes worsening cough at night that has been inhibiting sleep. Will give tussionex. Pmp reviewed and verified with no red flags.

## 2023-03-01 NOTE — Telephone Encounter (Signed)
Copied from CRM [id number]. Topic: Clinical - Red Word Triage >> Mar 01, 2023  8:16 AM Prudencio Pair wrote: Red Word that prompted transfer to Nurse Triage: Patient states she has a low grade fever of 99.5 & has had a cold for about 3 weeks. States her face, head & everything hurts. Would like to be seen today.   Chief Complaint: Cold Like Symptoms Symptoms: productive cough with yellow phlegm, low grade fever the past few days, and starting to get short of breath on exertion the past few days but not short of breath at rest Frequency: x 3 weeks but worse the past few days Pertinent Negatives: Patient denies blood in her sputum, recent travel out of the country, pregnancy Disposition: [] ED /[] Urgent Care (no appt availability in office) / [x] Appointment(In office/virtual)/ []  [facility]/ [] Home Care/ [] Refused Recommended Disposition /[] [facility]/ []  Follow-up with PCP Additional Notes: Patient called and advised that she has been dealing with a cold for about 3 weeks.  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  She does endorse being short of breath on exertion but not short of breath when at rest.  She states that the past few days she has started feeling worse.  She denies recent travel outside the country, blood in her sputum, or pregnancy.  Appointment is made for today 03/01/2023 at 10:10 am with Morey Hummingbird.  Patient is advised to go to the emergency room if anything gets worse and if not, we will see her here shortly at the office.  Patient verbalized understanding.  Reason for Disposition  [1] Continuous (nonstop) coughing interferes with work or school AND [2] no improvement using cough treatment per Care Advice  Answer Assessment - Initial Assessment Questions 1. ONSET: "When did the cough begin?"      3 weeks but worse in the past few days 2. SEVERITY: "How bad is the cough today?"      worse 3.  SPUTUM: "Describe the color of your sputum" (none, dry cough; clear, white, yellow, green)     yellowish 4. HEMOPTYSIS: "Are you coughing up any blood?" If so ask: "How much?" (flecks, streaks, tablespoons, etc.)     no 5. DIFFICULTY BREATHING: "Are you having difficulty breathing?" If Yes, ask: "How bad is it?" (e.g., mild, moderate, severe)    - MILD: No SOB at rest, mild SOB with walking, speaks normally in sentences, can lie down, no retractions, pulse < 100.    - MODERATE: SOB at rest, SOB with minimal exertion and prefers to sit, cannot lie down flat, speaks in phrases, mild retractions, audible wheezing, pulse 100-120.    - SEVERE: Very SOB at rest, speaks in single words, struggling to breathe, sitting hunched forward, retractions, pulse > 120      "A little short of breath on exertion" 6. FEVER: "Do you have a fever?" If Yes, ask: "What is your temperature, how was it measured, and when did it start?"     Low grade fever around 99 the past few days 7. CARDIAC HISTORY: "Do you have any history of heart disease?" (e.g., heart attack, congestive heart failure)      No 8. LUNG HISTORY: "Do you have any history of lung disease?"  (e.g., pulmonary embolus, asthma, emphysema)     No 9. PE RISK FACTORS: "Do you have a history of blood clots?" (or: recent major surgery, recent  prolonged travel, bedridden)     No 10. OTHER SYMPTOMS: "Do you have any other symptoms?" (e.g., runny nose, wheezing, chest pain)       "Feel a little bit wheezy" 11. PREGNANCY: "Is there any chance you are pregnant?" "When was your last menstrual period?"       No 12. TRAVEL: "Have you traveled out of the country in the last month?" (e.g., travel history, exposures)       No  Protocols used: Cough - Acute Productive-A-AH

## 2023-03-01 NOTE — Progress Notes (Signed)
Acute Office Visit  Subjective:     Patient ID: Jasmine Gomez, female    DOB: 1975/10/04, 48 y.o.   MRN: [id number]  Chief Complaint  Patient presents with   Facial Pain    Pt has had pressure behind her eyes, nasal congestion, no fever x3wks    HPI Patient is in today for sick visit. She has had about three weeks of a cough. Has also noted intermittent sinus pain. 3 days ago she started to get worsening head pressure and running fevers. 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  Review of Systems  Constitutional:  Positive for fever. Negative for chills.  HENT:  Positive for congestion.   Respiratory:  Negative for cough and shortness of breath.   Cardiovascular:  Negative for chest pain.  Neurological:  Negative for headaches.        Objective:    BP (!) 132/90 (BP Location: Left Arm, Patient Position: Sitting, Cuff Size: Normal)   Pulse 79   Ht 5\' 4"  (1.626 m)   Wt 170 lb 12 oz (77.5 kg)   SpO2 100%   BMI 29.31 kg/m    Physical Exam Vitals and nursing note reviewed.  Constitutional:      General: She is not in acute distress.    Appearance: Normal appearance.  HENT:     Head: Normocephalic and atraumatic.     Right Ear: External ear normal.     Left Ear: External ear normal.     Nose: Congestion present.     Mouth/Throat:     Pharynx: No posterior oropharyngeal erythema.  Eyes:     Conjunctiva/sclera: Conjunctivae normal.  Cardiovascular:     Rate and Rhythm: Normal rate and regular rhythm.  Pulmonary:     Effort: Pulmonary effort is normal.     Breath sounds: Normal breath sounds.  Neurological:     General: No focal deficit present.     Mental Status: She is alert and oriented to person, place, and time.  Psychiatric:        Mood and Affect: Mood normal.        Behavior: Behavior normal.        Thought Content: Thought content normal.        Judgment: Judgment normal.     Results for orders placed or performed  in visit on 03/01/23  POCT Influenza A/B  Result Value Ref Range   Influenza A, POC Negative Negative   Influenza B, POC Negative Negative        Assessment & Plan:   Problem List Items Addressed This Visit       Respiratory   Acute bacterial sinusitis - Primary   Flu A and B negative Likely sinus infection given fevers and head pressure. Will go ahead and start pt on augmentin - follow up if no better      Relevant Medications   chlorpheniramine-HYDROcodone (TUSSIONEX) 10-8 MG/5ML   amoxicillin-clavulanate (AUGMENTIN) 875-125 MG tablet     Other   Acute cough   Pt notes worsening cough at night that has been inhibiting sleep. Will give tussionex. Pmp reviewed and verified with no red flags.      Relevant Medications   chlorpheniramine-HYDROcodone (TUSSIONEX) 10-8 MG/5ML   Other Visit Diagnoses       Flu-like symptoms       Relevant Orders   POCT Influenza A/B (Completed)       Meds ordered this encounter  Medications   chlorpheniramine-HYDROcodone (TUSSIONEX) 10-8 MG/5ML    Sig: Take 5 mLs by mouth every 12 (twelve) hours as needed for cough (cough, will cause drowsiness.).    Dispense:  120 mL    Refill:  0   amoxicillin-clavulanate (AUGMENTIN) 875-125 MG tablet    Sig: Take 1 tablet by mouth 2 (two) times daily for 5 days.    Dispense:  10 tablet    Refill:  0    Return if symptoms worsen or fail to improve.  Charlton Amor, DO

## 2023-03-01 NOTE — Telephone Encounter (Signed)
 Appt made for today

## 2023-03-01 NOTE — Assessment & Plan Note (Signed)
Flu A and B negative Likely sinus infection given fevers and head pressure. Will go ahead and start pt on augmentin - follow up if no better

## 2023-03-09 ENCOUNTER — Ambulatory Visit: Payer: 59 | Attending: Cardiology | Admitting: Cardiology

## 2023-03-09 ENCOUNTER — Encounter: Payer: Self-pay | Admitting: Cardiology

## 2023-03-09 VITALS — BP 116/78 | HR 78 | Ht 64.0 in | Wt 169.0 lb

## 2023-03-09 DIAGNOSIS — F411 Generalized anxiety disorder: Secondary | ICD-10-CM

## 2023-03-09 DIAGNOSIS — Z7689 Persons encountering health services in other specified circumstances: Secondary | ICD-10-CM | POA: Diagnosis not present

## 2023-03-09 DIAGNOSIS — E782 Mixed hyperlipidemia: Secondary | ICD-10-CM | POA: Diagnosis not present

## 2023-03-09 DIAGNOSIS — R931 Abnormal findings on diagnostic imaging of heart and coronary circulation: Secondary | ICD-10-CM | POA: Diagnosis not present

## 2023-03-09 NOTE — Progress Notes (Signed)
Cardiology Consultation:    Date:  03/09/2023   ID:  Jasmine Gomez, DOB 10-18-1975, MRN [id number]  PCP:  Agapito Games, MD  Cardiologist:  Gypsy Balsam, MD   Referring MD: Agapito Games, *   Chief Complaint  Patient presents with   Abnormal CT Score    History of Present Illness:    Jasmine Gomez is a 48 y.o. female who is being seen today for the evaluation of abnormal calcium score at the request of Agapito Games, *.  Past medical history significant for dyslipidemia.  She said she got dyslipidemia for a long period of time gradually she was watched when her cholesterol struggling higher finally she was put on Crestor 10 mg daily she did have calcium score done calcium score is 5.55 which puts her in 77 percentile she is curious in our office to discuss this.  Overall she is doing well.  She denies have any chest pain tightness squeezing pressure burning chest she can walk climb stairs with no difficulties denies having any chest pain tightness squeezing pressure burning chest in the matter-of-fact she is try to walk on the regular basis have no difficulty doing it smoked for short period of time when she was in college trying to stick with good diet.  Past Medical History:  Diagnosis Date   Anxiety    Family history of breast cancer 11/16/2011   Mother and sister with  BrCa.  Patient is neg for BrCa gene.      Headache(784.0)    Migraines     Past Surgical History:  Procedure Laterality Date   NASAL SINUS SURGERY  1989   TONSILLECTOMY  1988   WISDOM TOOTH EXTRACTION     WRIST MASS EXCISION  10/2020    Current Medications: Current Meds  Medication Sig   chlorpheniramine-HYDROcodone (TUSSIONEX) 10-8 MG/5ML Take 5 mLs by mouth every 12 (twelve) hours as needed for cough (cough, will cause drowsiness.).   ibuprofen (ADVIL) 200 MG tablet Take 200 mg by mouth as needed.   methylphenidate 36 MG PO CR tablet Take 1 tablet (36 mg total) by  mouth daily.   methylphenidate 36 MG PO CR tablet Take 1 tablet (36 mg total) by mouth daily.   methylphenidate 36 MG PO CR tablet Take 1 tablet (36 mg total) by mouth daily.   rosuvastatin (CRESTOR) 10 MG tablet Take 1 tablet (10 mg total) by mouth at bedtime.     Allergies:   Patient has no known allergies.   Social History   Socioeconomic History   Marital status: Married    Spouse name: Casimiro Needle   Number of children: 2   Years of education: 17   Highest education level: Not on file  Occupational History   Occupation: Runner, broadcasting/film/video    Comment: [employer]  Tobacco Use   Smoking status: Former    Current packs/day: 0.00    Types: Cigarettes    Start date: 01/27/1999    Quit date: 01/26/2009    Years since quitting: 14.1   Smokeless tobacco: Never  Vaping Use   Vaping status: Never Used  Substance and Sexual Activity   Alcohol use: Yes    Alcohol/week: 6.0 standard drinks of alcohol    Types: 6 Glasses of wine per week   Drug use: No   Sexual activity: Yes    Partners: Male    Comment: Vasectomy  Other Topics Concern   Not on file  Social History Narrative  No regular exercise.     Social Drivers of Corporate investment banker Strain: Low Risk  (03/22/2022)   Received from [facility], [facility]   Overall Financial Resource Strain (CARDIA)    Difficulty of Paying Living Expenses: Not very hard  Food Insecurity: No Food Insecurity (03/22/2022)   Received from [facility], [facility]   Hunger Vital Sign    Worried About Running Out of Food in the Last Year: Never true    Ran Out of Food in the Last Year: Never true  Transportation Needs: No Transportation Needs (03/22/2022)   Received from [facility], [facility]   PRAPARE - Transportation    Lack of Transportation (Medical): No    Lack of Transportation (Non-Medical): No  Physical Activity: Sufficiently Active (03/22/2022)   Received from [facility], [facility]   Exercise Vital Sign    Days  of Exercise per Week: 5 days    Minutes of Exercise per Session: 50 min  Stress: No Stress Concern Present (03/22/2022)   Received from [facility], [facility] Occup[facility] - Occupational Stress Questionnaire    Feeling of Stress : Only a little  Social Connections: Moderately Integrated (03/22/2022)   Received from [facility]   Social Network    How would you rate your social network (family, work, friends)?: Adequate participation with social networks     Family History: The patient's family history includes Alzheimer's disease in her mother; BRCA 1/2 in her mother and sister; Breast cancer (age of onset: 58) in her sister; Breast cancer (age of onset: 25) in her mother; Breast cancer (age of onset: 67) in her maternal grandmother; Cancer in her paternal grandfather; Hyperlipidemia in her father and mother. There is no history of Colon cancer, Colon polyps, Esophageal cancer, Rectal cancer, or Stomach cancer. ROS:   Please see the history of present illness.    All 14 point review of systems negative except as described per history of present illness.  EKGs/Labs/Other Studies Reviewed:    The following studies were reviewed today: Coronary calcium score is 5.55 with 77 percentile  EKG:  EKG Interpretation Date/Time:  Tuesday March 09 2023 15:42:19 EST Ventricular Rate:  69 PR Interval:  132 QRS Duration:  76 QT Interval:  410 QTC Calculation: 439 R Axis:   61  Text Interpretation: Normal sinus rhythm Normal ECG No previous ECGs available Confirmed by Gypsy Balsam [phone]) on 03/09/2023 3:48:08 PM    Recent Labs: 11/19/2022: ALT 20; BUN 16; Creatinine, Ser 0.79; Hemoglobin 14.0; Platelets 311; Potassium 4.4; Sodium 140; TSH 1.840  Recent Lipid Panel    Component Value Date/Time   CHOL 290 (H) 11/19/2022 0957   TRIG 237 (H) 11/19/2022 0957   HDL 49 11/19/2022 0957   CHOLHDL 5.7 (H) 11/17/2021 1207   VLDL 34 (H)  11/20/2014 0838   LDLCALC 195 (H) 11/19/2022 0957   LDLCALC 169 (H) 11/17/2021 1207    Physical Exam:    VS:  BP 116/78 (BP Location: Right Arm, Patient Position: Sitting)   Pulse 78   Ht 5\' 4"  (1.626 m)   Wt 169 lb (76.7 kg)   SpO2 99%   BMI 29.01 kg/m     Wt Readings from Last 3 Encounters:  03/09/23 169 lb (76.7 kg)  03/01/23 170 lb 12 oz (77.5 kg)  11/20/22 168 lb 8 oz (76.4 kg)     GEN:  Well nourished, well developed in no acute  distress HEENT: Normal NECK: No JVD; No carotid bruits LYMPHATICS: No lymphadenopathy CARDIAC: RRR, no murmurs, no rubs, no gallops RESPIRATORY:  Clear to auscultation without rales, wheezing or rhonchi  ABDOMEN: Soft, non-tender, non-distended MUSCULOSKELETAL:  No edema; No deformity  SKIN: Warm and dry NEUROLOGIC:  Alert and oriented x 3 PSYCHIATRIC:  Normal affect   ASSESSMENT:    1. Encounter to establish care   2. Mixed hyperlipidemia   3. Elevated coronary artery calcium score   4. GAD (generalized anxiety disorder)    PLAN:    In order of problems listed above:  Mixed dyslipidemia she does have LDL of 195 HDL 49 this is the range of potential familial hyperlipidemia, however, she told me she did have some genetic testing done at the genetic testing did not show significant gene abnormality however I told her since she does have 3 children they need to be checked for cholesterol as well.  She initiated with Crestor which I think is an excellent choice.  I will put order in the computer to have fasting lipid profile, AST ALT as well as LP(a) done in about 6 weeks after initiation of cholesterol-lowering medication. Elevated calcium score we had a long discussion about this we did talk about preventative measures that she can do to prevent development of coronary disease which is exercises on the regular basis I recommended 5 times a week at least 30 minutes moderate intensity exercise, I also recommended Mediterranean diet we discussed  basic of this diet. History of anxiety disorder seems to be controlled and she said that exercise helps her a lot which I encouraged her to continue   Medication Adjustments/Labs and Tests Ordered: Current medicines are reviewed at length with the patient today.  Concerns regarding medicines are outlined above.  Orders Placed This Encounter  Procedures   ALT   AST   Lipid panel   EKG 12-Lead   No orders of the defined types were placed in this encounter.   Signed, Georgeanna Lea, MD, [facility]. 03/09/2023 4:11 PM    [facility]

## 2023-03-09 NOTE — Patient Instructions (Addendum)
Medication Instructions:  Your physician recommends that you continue on your current medications as directed. Please refer to the Current Medication list given to you today.  *If you need a refill on your cardiac medications before your next appointment, please call your pharmacy*   Lab Work: 3rd Floor   [address]  Your physician recommends that you return for lab work in: 6 weeks  You need to have labs done when you are fasting.  You can come Monday through Friday 8:00 am to 11:30AM and 1:00 to 4:00. You do not need to make an appointment as the order has already been placed.   Testing/Procedures: None Ordered   Follow-Up: At [facility], you and your health needs are our priority.  As part of our continuing mission to provide you with exceptional heart care, we have created designated Provider Care Teams.  These Care Teams include your primary Cardiologist (physician) and Advanced Practice Providers (APPs -  Physician Assistants and Nurse Practitioners) who all work together to provide you with the care you need, when you need it.  We recommend signing up for the patient portal called "MyChart".  Sign up information is provided on this After Visit Summary.  MyChart is used to connect with patients for Virtual Visits (Telemedicine).  Patients are able to view lab/test results, encounter notes, upcoming appointments, etc.  Non-urgent messages can be sent to your provider as well.   To learn more about what you can do with MyChart, go to [email or web address].    Your next appointment:   6 month(s)  The format for your next appointment:   In Person  Provider:   Gypsy Balsam, MD    Other Instructions NA

## 2023-03-24 ENCOUNTER — Other Ambulatory Visit: Payer: Self-pay | Admitting: Family Medicine

## 2023-03-24 DIAGNOSIS — F902 Attention-deficit hyperactivity disorder, combined type: Secondary | ICD-10-CM

## 2023-03-24 NOTE — Telephone Encounter (Signed)
 Last OV: 03/01/23 (acute visit) Next OV: 04/23/23 Last RF: 02/03/23

## 2023-03-26 MED ORDER — METHYLPHENIDATE HCL ER (OSM) 36 MG PO TBCR: 36.0000 mg | EXTENDED_RELEASE_TABLET | Freq: Every day | ORAL | 0 refills | Status: DC

## 2023-04-08 ENCOUNTER — Ambulatory Visit: Payer: BC Managed Care – PPO | Admitting: Family Medicine

## 2023-04-23 ENCOUNTER — Ambulatory Visit: Payer: 59 | Admitting: Family Medicine

## 2023-04-26 ENCOUNTER — Encounter: Payer: Self-pay | Admitting: Family Medicine

## 2023-04-26 ENCOUNTER — Ambulatory Visit (INDEPENDENT_AMBULATORY_CARE_PROVIDER_SITE_OTHER): Admitting: Family Medicine

## 2023-04-26 VITALS — BP 120/79 | HR 81 | Ht 64.0 in | Wt 169.0 lb

## 2023-04-26 DIAGNOSIS — F902 Attention-deficit hyperactivity disorder, combined type: Secondary | ICD-10-CM | POA: Diagnosis not present

## 2023-04-26 DIAGNOSIS — E785 Hyperlipidemia, unspecified: Secondary | ICD-10-CM

## 2023-04-26 DIAGNOSIS — R931 Abnormal findings on diagnostic imaging of heart and coronary circulation: Secondary | ICD-10-CM

## 2023-04-26 DIAGNOSIS — Z1231 Encounter for screening mammogram for malignant neoplasm of breast: Secondary | ICD-10-CM

## 2023-04-26 MED ORDER — METHYLPHENIDATE HCL ER (OSM) 36 MG PO TBCR: 36.0000 mg | EXTENDED_RELEASE_TABLET | Freq: Every day | ORAL | 0 refills | Status: DC

## 2023-04-26 NOTE — Assessment & Plan Note (Addendum)
 Doing well on statin.  Continue to work on diet and exercise.

## 2023-04-26 NOTE — Assessment & Plan Note (Signed)
 To recheck lipid panel and liver function will send a copy to Dr. Bing Matter

## 2023-04-26 NOTE — Progress Notes (Signed)
   Established Patient Office Visit  Subjective  Patient ID: Jasmine Gomez, female    DOB: May 14, 1975  Age: 48 y.o. MRN: [id number]  Chief Complaint  Patient presents with   ADHD    HPI  She reports that she has been tolerating the statin well without any side effects or problems she did do the consultation with cardiology for the elevated calcium score they recommended just continue to work with healthy diet making sure that she is exercising regularly to reduce her cardiovascular risk overall.  Will get updated lipid panel and liver function.  She just has not had a chance to return to get those drawn so we will go ahead and do that here today.  Has been walking for exercise and stress relief.    ROS    Objective:     BP 120/79   Pulse 81   Ht 5\' 4"  (1.626 m)   Wt 169 lb (76.7 kg)   SpO2 98%   BMI 29.01 kg/m    Physical Exam Vitals and nursing note reviewed.  Constitutional:      Appearance: Normal appearance.  HENT:     Head: Normocephalic and atraumatic.  Eyes:     Conjunctiva/sclera: Conjunctivae normal.  Cardiovascular:     Rate and Rhythm: Normal rate and regular rhythm.  Pulmonary:     Effort: Pulmonary effort is normal.     Breath sounds: Normal breath sounds.  Skin:    General: Skin is warm and dry.  Neurological:     Mental Status: She is alert.  Psychiatric:        Mood and Affect: Mood normal.      No results found for any visits on 04/26/23.    The 10-year ASCVD risk score (Arnett DK, et al., 2019) is: 1.8%    Assessment & Plan:   Problem List Items Addressed This Visit       Cardiovascular and Mediastinum   Elevated coronary artery calcium score   Doing well on statin.  Continue to work on diet and exercise.      Relevant Orders   CMP14+EGFR   Lipid panel     Other   Hyperlipidemia - Primary   To recheck lipid panel and liver function will send a copy to Dr. Bing Matter      Relevant Orders   CMP14+EGFR   Lipid panel    ADHD   Happy with her current dosing regimen it seems to be helpful and she is not having any side effect such as chest pain or palpitations.  Will go ahead and refill next 90-day supply.  If otherwise doing well follow-up in 6 months.      Relevant Medications   methylphenidate 36 MG PO CR tablet   methylphenidate 36 MG PO CR tablet (Start on 05/26/2023)   methylphenidate 36 MG PO CR tablet (Start on 06/25/2023)   Other Visit Diagnoses       Encounter for screening mammogram for malignant neoplasm of breast       Relevant Orders   MM 3D SCREENING MAMMOGRAM BILATERAL BREAST      Encouraged her to go ahead and schedule her Pap smear with GYN down the hall as well as her mammogram.  Return in about 6 months (around 10/26/2023) for ADHD.    Nani Gasser, MD

## 2023-04-26 NOTE — Assessment & Plan Note (Signed)
 Happy with her current dosing regimen it seems to be helpful and she is not having any side effect such as chest pain or palpitations.  Will go ahead and refill next 90-day supply.  If otherwise doing well follow-up in 6 months.

## 2023-04-26 NOTE — Patient Instructions (Signed)
 Send my a note in 3 months for your ADHD medication for the next 3 refills.

## 2023-04-27 ENCOUNTER — Encounter: Payer: Self-pay | Admitting: Family Medicine

## 2023-04-27 LAB — CMP14+EGFR
ALT: 28 IU/L (ref 0–32)
AST: 22 IU/L (ref 0–40)
Albumin: 4.6 g/dL (ref 3.9–4.9)
Alkaline Phosphatase: 67 IU/L (ref 44–121)
BUN/Creatinine Ratio: 22 (ref 9–23)
BUN: 15 mg/dL (ref 6–24)
Bilirubin Total: 0.4 mg/dL (ref 0.0–1.2)
CO2: 25 mmol/L (ref 20–29)
Calcium: 9.7 mg/dL (ref 8.7–10.2)
Chloride: 102 mmol/L (ref 96–106)
Creatinine, Ser: 0.68 mg/dL (ref 0.57–1.00)
Globulin, Total: 2 g/dL (ref 1.5–4.5)
Glucose: 91 mg/dL (ref 70–99)
Potassium: 4.3 mmol/L (ref 3.5–5.2)
Sodium: 140 mmol/L (ref 134–144)
Total Protein: 6.6 g/dL (ref 6.0–8.5)
eGFR: 108 mL/min/{1.73_m2} (ref >59)

## 2023-04-27 LAB — LIPID PANEL
Chol/HDL Ratio: 3.5 ratio (ref 0.0–4.4)
Cholesterol, Total: 172 mg/dL (ref 100–199)
HDL: 49 mg/dL (ref >39)
LDL Chol Calc (NIH): 100 mg/dL — ABNORMAL HIGH (ref 0–99)
Triglycerides: 132 mg/dL (ref 0–149)
VLDL Cholesterol Cal: 23 mg/dL (ref 5–40)

## 2023-04-27 NOTE — Progress Notes (Signed)
 Wow!  Impressive reduction in LDL!!!! Great work!! Metabolic panel is good.

## 2023-07-14 ENCOUNTER — Other Ambulatory Visit: Payer: Self-pay | Admitting: Family Medicine

## 2023-07-14 DIAGNOSIS — Z1231 Encounter for screening mammogram for malignant neoplasm of breast: Secondary | ICD-10-CM

## 2023-07-21 ENCOUNTER — Encounter

## 2023-07-21 ENCOUNTER — Ambulatory Visit: Admitting: Family Medicine

## 2023-07-21 ENCOUNTER — Ambulatory Visit

## 2023-07-21 ENCOUNTER — Encounter: Payer: Self-pay | Admitting: Family Medicine

## 2023-07-21 VITALS — BP 125/75 | HR 67 | Ht 64.0 in | Wt 169.0 lb

## 2023-07-21 DIAGNOSIS — M542 Cervicalgia: Secondary | ICD-10-CM | POA: Diagnosis not present

## 2023-07-21 DIAGNOSIS — Z23 Encounter for immunization: Secondary | ICD-10-CM | POA: Diagnosis not present

## 2023-07-21 DIAGNOSIS — Z1231 Encounter for screening mammogram for malignant neoplasm of breast: Secondary | ICD-10-CM

## 2023-07-21 NOTE — Progress Notes (Signed)
 Acute Office Visit  Subjective:     Patient ID: Jasmine Gomez, female    DOB: April 06, 1975, 48 y.o.   MRN: [id number]  Chief Complaint  Patient presents with   Neck Pain    Pt reports that she has been experiencing neck pain for years. She stated that the pain is more on L than R.     HPI Patient is in today for neck pain which is worse on the left.  She says it has been there for years but she does feel like it has been getting a little worse.  She notices it particularly when she tries to turn her head to the left while driving she actually has to rotated her waist to check her side view.  She says the rotation to the right seems pretty normal.  If she fully extends her head back she feels discomfort on the left.  She takes ibuprofen  almost every day, 600 mg at night for pain relief sometimes she is able to skip a day.  No pain radiating down towards her shoulders.  No known trauma or injuries.  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  ROS      Objective:    BP 125/75   Pulse 67   Ht 5' 4 (1.626 m)   Wt 169 lb (76.7 kg)   SpO2 99%   BMI 29.01 kg/m    Physical Exam Vitals reviewed.  Constitutional:      Appearance: Normal appearance.  HENT:     Head: Normocephalic.  Pulmonary:     Effort: Pulmonary effort is normal.   Musculoskeletal:     Comments: Normal flexion and extension, normal rotation right decreased rotation left.  Noticeable difference between left and right.  She did have pain on the left side with rotation to the left and with sidebending.  And with full extension.  Nontender over the trapezius.  Nontender paraspinous muscles some mild tenderness in the upper cervical spine.   Neurological:     Mental Status: She is alert and oriented to person, place, and time.   Psychiatric:        Mood and Affect: Mood normal.        Behavior: Behavior normal.     No results found for any visits on 07/21/23.      Assessment & Plan:   Problem List  Items Addressed This Visit   None Visit Diagnoses       Encounter for immunization    -  Primary   Relevant Orders   Tdap vaccine greater than or equal to 7yo IM (Completed)     Cervical pain       Relevant Orders   DG Cervical Spine Complete      Since has been going on for quite some time would like to start with a plain film to see if she might have some arthritis or degenerative disc that might be contributing to the limited range of motion and pain.  If we do see mild to moderate arthritis then we will start with formal physical therapy to see if we can improve pain and range of motion.  Which then in turn would allow her to decrease the amount of ibuprofen  she is taking.  It does help when she takes it.  We may also need to look at other long-term options then a chronic NSAID.  But she does have normal renal function and has not had any GI symptoms  No orders  of the defined types were placed in this encounter.   No follow-ups on file.  Jasmine Byars, MD

## 2023-07-23 ENCOUNTER — Ambulatory Visit: Payer: Self-pay | Admitting: Family Medicine

## 2023-07-23 DIAGNOSIS — M542 Cervicalgia: Secondary | ICD-10-CM

## 2023-07-23 NOTE — Progress Notes (Signed)
 Hi Jasmine Gomez, x-ray of your neck shows reversed lordosis that means there is some muscle spasm component.  Your neck is supposed to have some natural curvature and so it is pulling in the wrong direction.  Soft tissues look normal which is good.  No bony changes or tumors which is good.  They did see some arthritis and disc space narrowing at C5-6 and C6-7.  This is actually more towards the base of your neck I know you are having a little bit more pain closer to your skull.  So I would highly recommend formal physical therapy for your neck at this point.  If that something that you are open to we have a great PT department here in our building.  If there is something closer to home for you just let me know we really have some amazing PT places in the area.

## 2023-07-23 NOTE — Telephone Encounter (Signed)
 Jasmine Gomez

## 2023-07-26 ENCOUNTER — Other Ambulatory Visit: Payer: Self-pay | Admitting: *Deleted

## 2023-07-26 DIAGNOSIS — M542 Cervicalgia: Secondary | ICD-10-CM

## 2023-07-26 MED ORDER — TIZANIDINE HCL 4 MG PO TABS: 4.0000 mg | ORAL_TABLET | Freq: Three times a day (TID) | ORAL | 2 refills | Status: AC | PRN

## 2023-08-09 ENCOUNTER — Encounter: Payer: Self-pay | Admitting: Obstetrics & Gynecology

## 2023-08-09 ENCOUNTER — Ambulatory Visit: Admitting: Obstetrics & Gynecology

## 2023-08-09 ENCOUNTER — Encounter: Payer: Self-pay | Admitting: Family Medicine

## 2023-08-09 VITALS — BP 118/82 | HR 73 | Ht 64.0 in | Wt 169.0 lb

## 2023-08-09 DIAGNOSIS — Z1331 Encounter for screening for depression: Secondary | ICD-10-CM | POA: Diagnosis not present

## 2023-08-09 DIAGNOSIS — N329 Bladder disorder, unspecified: Secondary | ICD-10-CM

## 2023-08-09 DIAGNOSIS — Z01419 Encounter for gynecological examination (general) (routine) without abnormal findings: Secondary | ICD-10-CM | POA: Diagnosis not present

## 2023-08-09 DIAGNOSIS — R339 Retention of urine, unspecified: Secondary | ICD-10-CM

## 2023-08-09 DIAGNOSIS — F902 Attention-deficit hyperactivity disorder, combined type: Secondary | ICD-10-CM

## 2023-08-09 NOTE — Patient Instructions (Signed)
 Vaginal and Vulvar Moisturizers.    Moisturizers  They are used in the vagina to hydrate the mucous membrane that make up the vaginal canal.  Designed to keep a more normal acid balance (ph)  Once placed in the vagina, it will last between two to three days.  Use 2-3 times per week at bedtime  Ingredients to avoid is glycerin  and fragrance, can increase chance of infection  Should not be used just before sex due to causing irritation  Most are gels administered either in a tampon-shaped applicator or as a vaginal suppository. They are non-hormonal.  Best to massage the moisturizers into the vaginal canal and vulva area   Types of Moisturizers  Vitamin E vaginal suppositories- Whole foods, Amazon  Moist Again  Coconut oil- can break down condoms  Julva- (Do no use if on Tamoxifen) amazon  Yes moisturizer- amazon  NeuEve Silk , NeuEve Silver for menopausal or over 65 (if have severe vaginal atrophy or cancer treatments use NeuEve Silk for 1 month than move to [employer])- [employer], [place].com  Olive and Bee intimate cream- www.oliveandbee.com.au  Mae vaginal moisturizer- Amazon  Aloe  Bonafide  Hyaloronic Acid suppositories   Creams to use externally on the Vulva area daily  Marathon Oil (good for for cancer patients that had radiation to the area)- guam or [employer].https://garcia-valdez.org/  V-magic cream - amazon  Julva-amazon  Vital V Wild Yam salve ( help moisturize and help with thinning vulvar area, does have Beeswax  MoodMaid Botanical Pro-Meno Wild Yam Cream- Amazon  Desert Harvest Gele  Cleo by Sherrlyn labial moisturizer (Amazon,  Coconut or olive oil  Aloe  Yes  Good Clean Love  Enchanted rose    Things to avoid in the vaginal area  Do not use things to irritate the vulvar area    No lotions just specialized creams for the vulva area- Neogyn, V-magic, No soaps; can use Aveeno or Calendula cleanser if needed. Must be gentle  No deodorants  No douches  Good to  sleep without underwear to let the vaginal area to air out  No scrubbing: spread the lips to let warm water rinse over labias and pat dry

## 2023-08-09 NOTE — Progress Notes (Signed)
  Subjective:     Jasmine Gomez is a 48 y.o. female here for a routine exam.  Current complaints: none--LMP a couple of years ago no vaginal bleeding.  Patient does have difficulty finishing voiding.  She feels like she has to push and suprapubically to have her bladder completely empty.  She denies leaking of fluid other than a rare occasion when she is laughing hard.  Patient does not have urge incontinence symptoms.   Gynecologic History No LMP recorded. (Menstrual status: Perimenopausal). Contraception: vasectomy Last pap smear (date and result):11/07/2021 Last mammogram (date and result):02/25/2022 negative Last colon screening (date and result):2023 Pt reported WNL Brush:yes Floss:yes Seatbelts: yes Sunscreen: yes  Obstetric History OB History  Gravida Para Term Preterm AB Living  3 3 3   3   SAB IAB Ectopic Multiple Live Births      3    # Outcome Date GA Lbr Len/2nd Weight Sex Type Anes PTL Lv  3 Term 08/14/13 [redacted]w[redacted]d 18:26 / 00:22 8 lb 8.9 oz (3.881 kg) M Vag-Spont EPI  LIV  2 Term 08/26/07 [redacted]w[redacted]d  8 lb 6 oz (3.799 kg) M Vag-Spont EPI N LIV  1 Term 09/18/05 [redacted]w[redacted]d  8 lb 2 oz (3.685 kg) M Vag-Spont EPI N LIV     The following portions of the patient's history were reviewed and updated as appropriate: allergies, current medications, past family history, past medical history, past social history, past surgical history, and problem list.  Review of Systems Pertinent items noted in HPI and remainder of comprehensive ROS otherwise negative.    Objective:     Vitals:   08/09/23 1103 08/09/23 1107  BP: (!) 128/92 118/82  Pulse: 71 73  Weight: 169 lb (76.7 kg)   Height: 5' 4 (1.626 m)    Vitals:  WNL General appearance: alert, cooperative and no distress  HEENT: Normocephalic, without obvious abnormality, atraumatic Eyes: negative Throat: lips, mucosa, and tongue normal; teeth and gums normal  Respiratory: Clear to auscultation bilaterally  CV: Regular rate and  rhythm  Breasts:  Normal appearance, no masses or tenderness, no nipple retraction or dimpling  GI: Soft, non-tender; bowel sounds normal; no masses,  no organomegaly  GU: External Genitalia:  Tanner V, no lesion Urethra:  No prolapse   Vagina: Pink, normal rugae, no blood or discharge, mild bladder prolapse with valsalva  Cervix: No CMT, no lesion  Uterus:  Normal size and contour, non tender  Adnexa: Normal, no masses, non tender  Musculoskeletal: No edema, redness or tenderness in the calves or thighs  Skin: No lesions or rash  Lymphatic: Axillary adenopathy: none     Psychiatric: Normal mood and behavior       Assessment:    Healthy female exam.  Incomplete bladder emptying   Plan:  Pap smear is up-to-date. Patient gets annual mammograms. Patient sees dermatologist for skin survey.  She recently had Mohs surgery for a basal cell carcinoma. Colon cancer screening is up-to-date. Patient should optimize vitamin D  and calcium  as well as weightbearing exercises for bone health Incomplete bladder emptying--mild cystocele.  Will refer to urogyn for evaluation and treatment.  Patient has history of 3 vaginal deliveries counseling around pelvic organ prolapse.

## 2023-08-09 NOTE — Therapy (Incomplete)
 OUTPATIENT PHYSICAL THERAPY CERVICAL EVALUATION   Patient Name: Jasmine Gomez MRN: [id number] DOB:February 12, 1975, 48 y.o., female Today's Date: 08/09/2023  END OF SESSION:   Past Medical History:  Diagnosis Date   Anxiety    Family history of breast cancer 11/16/2011   Mother and sister with  BrCa.  Patient is neg for BrCa gene.      Headache(784.0)    Migraines    Past Surgical History:  Procedure Laterality Date   NASAL SINUS SURGERY  1989   TONSILLECTOMY  1988   WISDOM TOOTH EXTRACTION     WRIST MASS EXCISION  10/2020   Patient Active Problem List   Diagnosis Date Noted   Elevated coronary artery calcium  score 03/09/2023   Plantar fasciitis 11/20/2022   ADHD 06/15/2022   Genetic testing 12/15/2019   Stress 11/09/2019   Encounter for weight loss counseling 03/24/2019   History of basal cell cancer 10/19/2018   GAD (generalized anxiety disorder) 09/29/2018   Hyperlipidemia 10/19/2016   Family history of breast cancer 11/16/2011   Migraine with aura 11/16/2011   Allergic rhinitis 09/05/2010    PCP: Alvan Dorothyann BIRCH, MD   REFERRING PROVIDER: Alvan Dorothyann BIRCH, MD   REFERRING DIAG: M54.2 (ICD-10-CM) - Cervical pain   THERAPY DIAG:  No diagnosis found.  Rationale for Evaluation and Treatment: Rehabilitation  ONSET DATE: ***  SUBJECTIVE:                                                                                                                                                                                                         SUBJECTIVE STATEMENT: *** Hand dominance: {MISC; OT HAND DOMINANCE:[phone]}  PERTINENT HISTORY:  None   PAIN:  Are you having pain? Yes: NPRS scale: *** Pain location: *** Pain description: *** Aggravating factors: *** Relieving factors: ***  PRECAUTIONS: {Therapy precautions:24002}  RED FLAGS: None     WEIGHT BEARING RESTRICTIONS: No  FALLS:  Has patient fallen in last 6 months?  {fallsyesno:27318}  LIVING ENVIRONMENT: Lives with: {[facility] lives with:25569::lives with their family} Lives in: {Lives in:25570} Stairs: {opstairs:27293} Has following equipment at home: {Assistive devices:23999}  OCCUPATION: ***  PLOF: {PLOF:24004}  PATIENT GOALS: ***  NEXT MD VISIT: ***  OBJECTIVE:  Note: Objective measures were completed at Evaluation unless otherwise noted.  DIAGNOSTIC FINDINGS:  Cervical X-ray: IMPRESSION: Degenerative changes. No acute osseous abnormalities.  PATIENT SURVEYS:  NDI: ***  COGNITION: Overall cognitive status: Within functional limits for tasks assessed  SENSATION: {sensation:27233}  POSTURE: {posture:25561}  PALPATION: ***   CERVICAL ROM:   Active ROM A/PROM (  deg) eval  Flexion   Extension   Right lateral flexion   Left lateral flexion   Right rotation   Left rotation    (Blank rows = not tested)  UPPER EXTREMITY ROM:  Active ROM Right eval Left eval  Shoulder flexion    Shoulder extension    Shoulder abduction    Shoulder adduction    Shoulder extension    Shoulder internal rotation    Shoulder external rotation    Elbow flexion    Elbow extension    Wrist flexion    Wrist extension    Wrist ulnar deviation    Wrist radial deviation    Wrist pronation    Wrist supination     (Blank rows = not tested)  UPPER EXTREMITY MMT:  MMT Right eval Left eval  Shoulder flexion    Shoulder extension    Shoulder abduction    Shoulder adduction    Shoulder extension    Shoulder internal rotation    Shoulder external rotation    Middle trapezius    Lower trapezius    Elbow flexion    Elbow extension    Wrist flexion    Wrist extension    Wrist ulnar deviation    Wrist radial deviation    Wrist pronation    Wrist supination    Grip strength     (Blank rows = not tested)  CERVICAL SPECIAL TESTS:  Cervical compression  Cervical Distraction   FUNCTIONAL TESTS:  {Functional tests:24029}  [facility]  Adult PT Treatment:                                                DATE: 08/10/23 Therapeutic Exercise: Demonstrated,performed, and issued initial HEP.   Manual Therapy: *** Neuromuscular re-ed: *** Therapeutic Activity: *** Modalities: *** Self Care: ***                                                                                                                         PATIENT EDUCATION:  Education details: see treatment  Person educated: Patient Education method: Explanation, Demonstration, Tactile cues, Verbal cues, and Handouts Education comprehension: verbalized understanding, returned demonstration, verbal cues required, tactile cues required, and needs further education  HOME EXERCISE PROGRAM: ***  ASSESSMENT:  CLINICAL IMPRESSION: Patient is a 48 y.o. female who was seen today for physical therapy evaluation and treatment for cervical pain.   OBJECTIVE IMPAIRMENTS: {opptimpairments:25111}.   ACTIVITY LIMITATIONS: {activitylimitations:27494}  PARTICIPATION LIMITATIONS: {participationrestrictions:25113}  PERSONAL FACTORS: {Personal factors:25162} are also affecting patient's functional outcome.   REHAB POTENTIAL: {rehabpotential:25112}  CLINICAL DECISION MAKING: {clinical decision making:25114}  EVALUATION COMPLEXITY: {Evaluation complexity:25115}   GOALS: Goals reviewed with patient? Yes  SHORT TERM GOALS: Target date: ***  Patient will be independent and compliant with initial HEP.   Baseline: initial HEP issued  Goal status: INITIAL  2.  *** Baseline:  Goal status: INITIAL  3.  *** Baseline:  Goal status: INITIAL  4.  *** Baseline:  Goal status: INITIAL  5.  *** Baseline:  Goal status: INITIAL  6.  *** Baseline:  Goal status: INITIAL  LONG TERM GOALS: Target date: ***  Patient will score </= *** % disability on the Neck Disability Index (MDC is 20%) to signify clinically meaningful improvement in functional abilities.    Baseline: see above  Goal status: INITIAL  2.  *** Baseline:  Goal status: INITIAL  3.  *** Baseline:  Goal status: INITIAL  4.  *** Baseline:  Goal status: INITIAL  5.  *** Baseline:  Goal status: INITIAL  6.  *** Baseline:  Goal status: INITIAL   PLAN:  PT FREQUENCY: {rehab frequency:25116}  PT DURATION: {rehab duration:25117}  PLANNED INTERVENTIONS: {rehab planned interventions:25118::97110-Therapeutic exercises,97530- Therapeutic [phone]- Neuromuscular re-education,97535- Self Rjmz,02859- Manual therapy}  PLAN FOR NEXT SESSION: PIERRETTE Lucie Meeter, PT, DPT, ATC 08/09/23 10:49 AM

## 2023-08-10 ENCOUNTER — Ambulatory Visit

## 2023-08-10 ENCOUNTER — Encounter: Payer: Self-pay | Admitting: Family Medicine

## 2023-08-12 ENCOUNTER — Ambulatory Visit

## 2023-08-12 DIAGNOSIS — Z1231 Encounter for screening mammogram for malignant neoplasm of breast: Secondary | ICD-10-CM | POA: Diagnosis not present

## 2023-08-13 MED ORDER — METHYLPHENIDATE HCL ER (OSM) 36 MG PO TBCR: 36.0000 mg | EXTENDED_RELEASE_TABLET | Freq: Every day | ORAL | 0 refills | Status: DC

## 2023-08-13 MED ORDER — METHYLPHENIDATE HCL ER (OSM) 36 MG PO TBCR
36.0000 mg | EXTENDED_RELEASE_TABLET | Freq: Every day | ORAL | 0 refills | Status: DC
Start: 2023-09-10 — End: 2023-10-26

## 2023-08-17 ENCOUNTER — Telehealth: Payer: Self-pay | Admitting: Family Medicine

## 2023-08-17 ENCOUNTER — Ambulatory Visit: Payer: Self-pay | Admitting: Family Medicine

## 2023-08-17 ENCOUNTER — Encounter: Payer: Self-pay | Admitting: Family Medicine

## 2023-08-17 NOTE — Telephone Encounter (Signed)
 Copied from CRM [id number]. Topic: Referral - Question >> Aug 17, 2023 11:09 AM Antwanette L wrote: Reason for CRM: Patient is requesting a call back from [facility]. The patient needs to speak with Jon regarding a PT referral to [facility]. The patient can be contacted at [phone]

## 2023-08-17 NOTE — Progress Notes (Signed)
 Please call patient. Normal mammogram.  Repeat in 1 year.

## 2023-08-18 NOTE — Telephone Encounter (Signed)
 Called patient. Left a voice mail message requesting a return call.

## 2023-08-19 NOTE — Telephone Encounter (Signed)
 Per patient issue has been resolved. No further assistance needed at this time.

## 2023-10-26 ENCOUNTER — Encounter: Payer: Self-pay | Admitting: Family Medicine

## 2023-10-26 ENCOUNTER — Ambulatory Visit: Admitting: Family Medicine

## 2023-10-26 VITALS — BP 127/81 | HR 72 | Ht 64.0 in | Wt 169.1 lb

## 2023-10-26 DIAGNOSIS — Z23 Encounter for immunization: Secondary | ICD-10-CM | POA: Diagnosis not present

## 2023-10-26 DIAGNOSIS — F902 Attention-deficit hyperactivity disorder, combined type: Secondary | ICD-10-CM | POA: Diagnosis not present

## 2023-10-26 MED ORDER — METHYLPHENIDATE HCL ER (OSM) 36 MG PO TBCR: 36.0000 mg | EXTENDED_RELEASE_TABLET | Freq: Every day | ORAL | 0 refills | Status: DC

## 2023-10-26 NOTE — Assessment & Plan Note (Signed)
 Well controlled. Continue current regimen. Follow up in  6 mo

## 2023-10-26 NOTE — Progress Notes (Signed)
   Established Patient Office Visit  Subjective  Patient ID: Jasmine Gomez, female    DOB: [birth year]  Age: 48 y.o. MRN: [id number]  Chief Complaint  Patient presents with   ADHD    HPI  Here today for follow-up ADHD-currently doing okay.  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  There is been a lot of stressful things going on just in general.  Still struggles a little bit with focus but overall feels like the methylphenidate  36 mg is working well.  No recent chest pain shortness of breath or difficulty with sleep.  Hyperlipidemia-tolerating statin well without any side effects or problems.  She has a physical coming up later this fall.  Ended up doing physical therapy for her neck and it really did make a big difference she has her range of motion back she even ended up doing some dry needling.     ROS    Objective:     BP 127/81   Pulse 72   Ht 5' 4 (1.626 m)   Wt 169 lb 1.9 oz (76.7 kg)   SpO2 98%   BMI 29.03 kg/m    Physical Exam Vitals and nursing note reviewed.  Constitutional:      Appearance: Normal appearance.  HENT:     Head: Normocephalic and atraumatic.  Eyes:     Conjunctiva/sclera: Conjunctivae normal.  Cardiovascular:     Rate and Rhythm: Normal rate and regular rhythm.  Pulmonary:     Effort: Pulmonary effort is normal.     Breath sounds: Normal breath sounds.  Skin:    General: Skin is warm and dry.  Neurological:     Mental Status: She is alert.  Psychiatric:        Mood and Affect: Mood normal.      No results found for any visits on 10/26/23.    The 10-year ASCVD risk score (Arnett DK, et al., 2019) is: 1%    Assessment & Plan:   Problem List Items Addressed This Visit       Other   ADHD   Well controlled. Continue current regimen. Follow up in  39mo        Relevant Medications   methylphenidate  36 MG PO CR tablet (Start on 11/10/2023)   methylphenidate  36 MG PO CR  tablet (Start on 12/10/2023)   methylphenidate  36 MG PO CR tablet (Start on 01/07/2024)   Other Visit Diagnoses       Encounter for immunization    -  Primary   Relevant Orders   Flu vaccine trivalent PF, 39mos and older(Flulaval,Afluria,Fluarix,Fluzone) (Completed)        Return in about 6 months (around 04/24/2024).    Dorothyann Byars, MD

## 2023-11-22 ENCOUNTER — Encounter: Payer: Self-pay | Admitting: Family Medicine

## 2023-11-22 ENCOUNTER — Ambulatory Visit (INDEPENDENT_AMBULATORY_CARE_PROVIDER_SITE_OTHER): Payer: BC Managed Care – PPO | Admitting: Family Medicine

## 2023-11-22 VITALS — BP 124/78 | HR 67 | Ht 64.0 in | Wt 169.0 lb

## 2023-11-22 DIAGNOSIS — Z0000 Encounter for general adult medical examination without abnormal findings: Secondary | ICD-10-CM

## 2023-11-22 DIAGNOSIS — F902 Attention-deficit hyperactivity disorder, combined type: Secondary | ICD-10-CM

## 2023-11-22 DIAGNOSIS — K2100 Gastro-esophageal reflux disease with esophagitis, without bleeding: Secondary | ICD-10-CM | POA: Diagnosis not present

## 2023-11-22 NOTE — Assessment & Plan Note (Signed)
[place] to refill when due.

## 2023-11-22 NOTE — Progress Notes (Signed)
 Complete physical exam  Patient: Jasmine Gomez    DOB: [birth year] 48 y.o.   MRN: [id number]  Chief Complaint  Patient presents with   Annual Exam    Subjective:    Jasmine Gomez is a 49 y.o. female who presents today for a complete physical exam. She reports consuming a general diet. Walking and hiking for exercise.  She generally feels well. She reports sleeping fairly well. She does not have additional problems to discuss today.   Most recent fall risk assessment:    10/26/2023    3:51 PM  Fall Risk   Falls in the past year? 0  Number falls in past yr: 0  Injury with Fall? 0  Risk for fall due to : No Fall Risks  Follow up Falls evaluation completed     Most recent depression screenings:    11/22/2023    3:18 PM 10/26/2023    3:51 PM  PHQ 2/9 Scores  PHQ - 2 Score 2 1  PHQ- 9 Score 4 2        Patient Care Team: Alvan Dorothyann BIRCH, MD as PCP - General (Family Medicine) Bernie Lamar PARAS, MD as Consulting Physician (Cardiology)   ROS    Objective:    BP 124/78   Pulse 67   Ht 5' 4 (1.626 m)   Wt 169 lb (76.7 kg)   SpO2 98%   BMI 29.01 kg/m     Physical Exam Constitutional:      Appearance: Normal appearance.  HENT:     Head: Normocephalic and atraumatic.     Right Ear: Tympanic membrane, ear canal and external ear normal.     Left Ear: Tympanic membrane, ear canal and external ear normal.     Nose: Nose normal.     Mouth/Throat:     Pharynx: Oropharynx is clear.  Eyes:     Extraocular Movements: Extraocular movements intact.     Conjunctiva/sclera: Conjunctivae normal.     Pupils: Pupils are equal, round, and reactive to light.  Neck:     Thyroid: No thyromegaly.  Cardiovascular:     Rate and Rhythm: Normal rate and regular rhythm.  Pulmonary:     Effort: Pulmonary effort is normal.     Breath sounds: Normal breath sounds.  Abdominal:     General: Bowel sounds are normal.     Palpations: Abdomen is soft.     Tenderness:  There is no abdominal tenderness.  Musculoskeletal:        General: No swelling.     Cervical back: Neck supple.  Skin:    General: Skin is warm and dry.  Neurological:     Mental Status: She is oriented to person, place, and time.  Psychiatric:        Mood and Affect: Mood normal.        Behavior: Behavior normal.       No results found for any visits on 11/22/23.      Assessment & Plan:    Routine Health Maintenance and Physical Exam Immunization History  Administered Date(s) Administered   Influenza Nasal 10/23/2014   Influenza Split 11/16/2011   Influenza, Seasonal, Injecte, Preservative Fre 10/09/2022, 10/26/2023   Influenza,inj,Quad PF,6+ Mos 10/19/2016, 09/09/2017, 10/20/2018, 11/09/2019, 11/17/2021   Influenza-Unspecified 12/26/2012   PFIZER(Purple Top)SARS-COV-2 Vaccination 03/25/2019, 04/17/2019, 04/26/2019, 10/30/2019   PPD Test 09/05/2010   Pfizer(Comirnaty)Fall Seasonal Vaccine 12 years and older 10/09/2022   Tdap 09/05/2010, 06/01/2013, 07/21/2023    Health Maintenance  Topic Date  Due   Hepatitis B Vaccines 19-59 Average Risk (1 of 3 - 19+ 3-dose series) Never done   COVID-19 Vaccine (6 - 2025-26 season) 09/27/2023   Mammogram  08/11/2025   Cervical Cancer Screening (HPV/Pap Cotest)  11/18/2026   Colonoscopy  02/27/2031   DTaP/Tdap/Td (4 - Td or Tdap) 07/20/2033   Influenza Vaccine  Completed   Hepatitis C Screening  Completed   HIV Screening  Completed   Pneumococcal Vaccine  Aged Out   HPV VACCINES  Aged Out   Meningococcal B Vaccine  Aged Out    Discussed health benefits of physical activity, and encouraged her to engage in regular exercise appropriate for her age and condition.  Problem List Items Addressed This Visit       Other   ADHD   OK to refill when due.       Other Visit Diagnoses       Wellness examination    -  Primary   Relevant Orders   CMP14+EGFR   CBC     Gastroesophageal reflux disease with esophagitis without  hemorrhage          Doing well overall.   Will get additional exercise.   GERD - ok to stop statin for 2 weeks. If not better then consider other causes. Work on dietary changes, handout given. Consider H pylori testing. Brother with with Barrett's esophagus.    Assessment and Plan Assessment & Plan     Return in about 6 months (around 05/22/2024) for ADD.    Dorothyann Byars, MD [facility] at [facility]

## 2023-11-23 ENCOUNTER — Ambulatory Visit: Payer: Self-pay | Admitting: Family Medicine

## 2023-11-23 LAB — CMP14+EGFR
ALT: 25 IU/L (ref 0–32)
AST: 20 IU/L (ref 0–40)
Albumin: 4.7 g/dL (ref 3.9–4.9)
Alkaline Phosphatase: 65 IU/L (ref 41–116)
BUN/Creatinine Ratio: 20 (ref 9–23)
BUN: 15 mg/dL (ref 6–24)
Bilirubin Total: 0.5 mg/dL (ref 0.0–1.2)
CO2: 24 mmol/L (ref 20–29)
Calcium: 9.8 mg/dL (ref 8.7–10.2)
Chloride: 101 mmol/L (ref 96–106)
Creatinine, Ser: 0.75 mg/dL (ref 0.57–1.00)
Globulin, Total: 2.3 g/dL (ref 1.5–4.5)
Glucose: 89 mg/dL (ref 70–99)
Potassium: 4.3 mmol/L (ref 3.5–5.2)
Sodium: 140 mmol/L (ref 134–144)
Total Protein: 7 g/dL (ref 6.0–8.5)
eGFR: 98 mL/min/1.73 (ref >59)

## 2023-11-23 LAB — CBC
Hematocrit: 42.5 % (ref 34.0–46.6)
Hemoglobin: 14.1 g/dL (ref 11.1–15.9)
MCH: 30.9 pg (ref 26.6–33.0)
MCHC: 33.2 g/dL (ref 31.5–35.7)
MCV: 93 fL (ref 79–97)
Platelets: 342 x10E3/uL (ref 150–450)
RBC: 4.56 x10E6/uL (ref 3.77–5.28)
RDW: 12.2 % (ref 11.7–15.4)
WBC: 8.8 x10E3/uL (ref 3.4–10.8)

## 2023-11-23 NOTE — Progress Notes (Signed)
 Your lab work is within acceptable range and there are no concerning findings.   ?

## 2023-11-28 ENCOUNTER — Encounter

## 2023-11-28 ENCOUNTER — Encounter: Payer: Self-pay | Admitting: Family Medicine

## 2023-11-29 ENCOUNTER — Telehealth (INDEPENDENT_AMBULATORY_CARE_PROVIDER_SITE_OTHER): Admitting: Family Medicine

## 2023-11-29 ENCOUNTER — Encounter: Payer: Self-pay | Admitting: Family Medicine

## 2023-11-29 VITALS — Temp 98.6°F

## 2023-11-29 DIAGNOSIS — U071 COVID-19: Secondary | ICD-10-CM

## 2023-11-29 NOTE — Progress Notes (Signed)
 MyChart Video Visit    Virtual Visit via Video Note   This format is felt to be most appropriate for this patient at this time. Physical exam was limited by quality of the video and audio technology used for the visit.    Patient location: home Provider location: [facility] Persons involved in the visit: patient, provider  I discussed the limitations of evaluation and management by telemedicine and the availability of in person appointments. The patient expressed understanding and agreed to proceed.  Patient: Jasmine Gomez   DOB: February 02, 1975   48 y.o. Female  MRN: [id number] Visit Date: 11/29/2023  Today's healthcare provider: Dorothyann Byars, MD   Chief Complaint  Patient presents with   Covid Positive    Subjective:    HPI  Started feeling bad on Saturday fever of 101.5. she took Covid test and it was Positive.   Mostly nasal congestion. No cough.  Took some [place].  Fever all day yesterday. Feels little better today  She does not have a fever today. She stated that she's just got nasal congestion.    She is schedule to go on a trip Thursday.   Last CMP checked 10/27    ROS        Objective:    Temp 98.6 F (37 C) (Oral)       Physical Exam Vitals and nursing note reviewed.  Constitutional:      Appearance: Normal appearance.  HENT:     Head: Normocephalic and atraumatic.  Eyes:     Conjunctiva/sclera: Conjunctivae normal.  Cardiovascular:     Rate and Rhythm: Normal rate and regular rhythm.  Pulmonary:     Effort: Pulmonary effort is normal.     Breath sounds: Normal breath sounds.  Skin:    General: Skin is warm and dry.  Neurological:     Mental Status: She is alert.  Psychiatric:        Mood and Affect: Mood normal.         Assessment & Plan:    Problem List Items Addressed This Visit   None Visit Diagnoses       COVID-19    -  Primary       COVID 19-continue symptomatic care is mostly more sinus no significant cough.  As long as she is fever free for 24 hours she can return to work tomorrow but did encourage her to make sure that she is distancing and using good hand hygiene as she technically will still be contagious.  She is supposed to travel with a friend on Thursday and Friday later this week again just encouraged her to be cautious if she starts developing more of a cough then I would be a little bit more concerned about her being able to more easily spread it.  She can also consider wearing a mask.  Will place a work note for today in her chart if she does decide to stay home tomorrow we can extend that as well.  No orders of the defined types were placed in this encounter.    Return if symptoms worsen or fail to improve.     I discussed the assessment and treatment plan with the patient. The patient was provided an opportunity to ask questions and all were answered. The patient agreed with the plan and demonstrated an understanding  of the instructions.   The patient was advised to call back or seek an in-person evaluation if the symptoms worsen or if the condition fails to improve as anticipated.  I provided 10 minutes of non-face-to-face time during this encounter.  Dorothyann Byars, MD [facility] at [facility]

## 2023-11-29 NOTE — Progress Notes (Signed)
 Started feeling bad on Saturday fever of 101.5. she took Covid test and it was Positive.   She does not have a fever today. She stated that she's just got nasal congestion.   She is schedule to go on a trip Thursday.  Last CMP checked 10/27

## 2023-11-30 ENCOUNTER — Encounter: Payer: Self-pay | Admitting: Family Medicine

## 2024-01-08 ENCOUNTER — Other Ambulatory Visit: Payer: Self-pay | Admitting: Family Medicine

## 2024-01-08 DIAGNOSIS — R931 Abnormal findings on diagnostic imaging of heart and coronary circulation: Secondary | ICD-10-CM

## 2024-01-08 DIAGNOSIS — E785 Hyperlipidemia, unspecified: Secondary | ICD-10-CM

## 2024-01-10 ENCOUNTER — Encounter: Payer: Self-pay | Admitting: Family Medicine

## 2024-01-10 DIAGNOSIS — F902 Attention-deficit hyperactivity disorder, combined type: Secondary | ICD-10-CM

## 2024-01-11 NOTE — Telephone Encounter (Signed)
 Requesting rx rf of methylphenidate  36mg  CR  Shows a current prescription written 01/07/2024 Called [facility]. -spoke with pharmacist and they did have the prescription and will get this ready for the patient.  Should be ready in about one hour ( spoke with pharmacy at 9:33 am)

## 2024-01-19 ENCOUNTER — Encounter: Payer: Self-pay | Admitting: Family Medicine

## 2024-01-21 MED ORDER — OSELTAMIVIR PHOSPHATE 75 MG PO CAPS: 75.0000 mg | ORAL_CAPSULE | Freq: Every day | ORAL | 0 refills | Status: DC

## 2024-02-12 ENCOUNTER — Encounter: Payer: Self-pay | Admitting: Family Medicine

## 2024-02-12 DIAGNOSIS — F902 Attention-deficit hyperactivity disorder, combined type: Secondary | ICD-10-CM

## 2024-02-14 MED ORDER — METHYLPHENIDATE HCL ER (OSM) 36 MG PO TBCR: 36.0000 mg | EXTENDED_RELEASE_TABLET | Freq: Every day | ORAL | 0 refills | Status: AC

## 2024-02-24 ENCOUNTER — Encounter: Payer: Self-pay | Admitting: Family Medicine

## 2024-02-24 ENCOUNTER — Ambulatory Visit: Payer: Self-pay

## 2024-02-24 ENCOUNTER — Ambulatory Visit: Admitting: Family Medicine

## 2024-02-24 VITALS — BP 132/85 | HR 78 | Ht 64.0 in | Wt 173.0 lb

## 2024-02-24 DIAGNOSIS — H00015 Hordeolum externum left lower eyelid: Secondary | ICD-10-CM

## 2024-02-24 DIAGNOSIS — H00019 Hordeolum externum unspecified eye, unspecified eyelid: Secondary | ICD-10-CM | POA: Insufficient documentation

## 2024-02-24 MED ORDER — ERYTHROMYCIN 5 MG/GM OP OINT: 1.0000 | TOPICAL_OINTMENT | Freq: Three times a day (TID) | OPHTHALMIC | 0 refills | Status: AC

## 2024-02-24 NOTE — Assessment & Plan Note (Signed)
 Recommend frequent warm compresses over the next few days.  She may add erythromycin  ointment if not improving with conservative measures.

## 2024-02-24 NOTE — Telephone Encounter (Signed)
 Patient shows scheduled today 02/24/2024 with Dr. Alvia.

## 2024-02-24 NOTE — Progress Notes (Signed)
 " Jasmine Gomez - 49 y.o. female MRN [id number]  Date of birth: 04-29-1975  Subjective Chief Complaint  Patient presents with   Belepharitis    HPI Jasmine Gomez is a 49 y.o. female here today with complaint of swelling of the left lower eyelid.  She does have pain with this.  Denies drainage, vision changes, headache or fevers.   ROS:  A comprehensive ROS was completed and negative except as noted per HPI  Allergies[1]  Past Medical History:  Diagnosis Date   Anxiety    Family history of breast cancer 11/16/2011   Mother and sister with  BrCa.  Patient is neg for BrCa gene.      Headache(784.0)    Migraines     Past Surgical History:  Procedure Laterality Date   NASAL SINUS SURGERY  1989   TONSILLECTOMY  1988   WISDOM TOOTH EXTRACTION     WRIST MASS EXCISION  10/2020    Social History   Socioeconomic History   Marital status: Married    Spouse name: Ozell   Number of children: 2   Years of education: 17   Highest education level: Not on file  Occupational History   Occupation: runner, broadcasting/film/video    Comment: [employer]  Tobacco Use   Smoking status: Former    Current packs/day: 0.00    Types: Cigarettes    Start date: 01/27/1999    Quit date: 01/26/2009    Years since quitting: 15.0   Smokeless tobacco: Never  Vaping Use   Vaping status: Never Used  Substance and Sexual Activity   Alcohol use: Yes    Alcohol/week: 6.0 standard drinks of alcohol    Types: 6 Glasses of wine per week   Drug use: No   Sexual activity: Yes    Partners: Male    Comment: Vasectomy  Other Topics Concern   Not on file  Social History Narrative   No regular exercise.     Social Drivers of Health   Tobacco Use: Medium Risk (02/24/2024)   Patient History    Smoking Tobacco Use: Former    Smokeless Tobacco Use: Never    Passive Exposure: Not on file  Financial Resource Strain: Low Risk (11/22/2023)   Overall Financial Resource Strain (CARDIA)    Difficulty of Paying  Living Expenses: Not very hard  Food Insecurity: No Food Insecurity (11/22/2023)   Epic    Worried About Programme Researcher, Broadcasting/film/video in the Last Year: Never true    Ran Out of Food in the Last Year: Never true  Transportation Needs: No Transportation Needs (11/22/2023)   Epic    Lack of Transportation (Medical): No    Lack of Transportation (Non-Medical): No  Physical Activity: Sufficiently Active (11/22/2023)   Exercise Vital Sign    Days of Exercise per Week: 5 days    Minutes of Exercise per Session: 50 min  Stress: No Stress Concern Present (11/22/2023)   [employer] of Occupational Health - Occupational Stress Questionnaire    Feeling of Stress: Not at all  Social Connections: Socially Integrated (11/22/2023)   Social Connection and Isolation Panel    Frequency of Communication with Friends and Family: Not on file    Frequency of Social Gatherings with Friends and Family: Three times a week    Attends Religious Services: More than 4 times per year    Active Member of Clubs or Organizations: Yes    Attends Banker Meetings: More than 4 times per year  Marital Status: Married  Depression (PHQ2-9): Low Risk (11/22/2023)   Depression (PHQ2-9)    PHQ-2 Score: 4  Alcohol Screen: Low Risk (11/22/2023)   Alcohol Screen    Last Alcohol Screening Score (AUDIT): 0  Housing: Low Risk (11/22/2023)   Epic    Unable to Pay for Housing in the Last Year: No    Number of Times Moved in the Last Year: 0    Homeless in the Last Year: No  Utilities: Not At Risk (11/22/2023)   Epic    Threatened with loss of utilities: No  Health Literacy: Adequate Health Literacy (11/22/2023)   B1300 Health Literacy    Frequency of need for help with medical instructions: Rarely    Family History  Problem Relation Age of Onset   Cancer Paternal Grandfather        sinus cancer   Breast cancer Maternal Grandmother 40   Hyperlipidemia Father    Breast cancer Mother 85       again 35    BRCA 1/2 Mother    Alzheimer's disease Mother    Hyperlipidemia Mother    Breast cancer Sister 48       during pregnancy   BRCA 1/2 Sister    Osteosarcoma Sister    Colon cancer Neg Hx    Colon polyps Neg Hx    Esophageal cancer Neg Hx    Rectal cancer Neg Hx    Stomach cancer Neg Hx     Health Maintenance  Topic Date Due   Hepatitis B Vaccines 19-59 Average Risk (1 of 3 - 19+ 3-dose series) Never done   COVID-19 Vaccine (6 - 2025-26 season) 09/27/2023   Mammogram  08/11/2025   Cervical Cancer Screening (HPV/Pap Cotest)  11/18/2026   Colonoscopy  02/27/2031   DTaP/Tdap/Td (4 - Td or Tdap) 07/20/2033   Influenza Vaccine  Completed   HPV VACCINES (No Doses Required) Completed   Hepatitis C Screening  Completed   HIV Screening  Completed   Pneumococcal Vaccine  Aged Out   Meningococcal B Vaccine  Aged Out     ----------------------------------------------------------------------------------------------------------------------------------------------------------------------------------------------------------------- Physical Exam There were no vitals taken for this visit.  Physical Exam Constitutional:      Appearance: Normal appearance.  HENT:     Head: Normocephalic and atraumatic.  Eyes:     Comments: Hordeolum of L lower eyelid.   Neurological:     Mental Status: She is alert.     ------------------------------------------------------------------------------------------------------------------------------------------------------------------------------------------------------------------- Assessment and Plan  Hordeolum Recommend frequent warm compresses over the next few days.  She may add erythromycin  ointment if not improving with conservative measures.    Meds ordered this encounter  Medications   erythromycin  ophthalmic ointment    Sig: Place 1 Application into both eyes 3 (three) times daily.    Dispense:  3.5 g    Refill:  0    No follow-ups on  file.        [1] No Known Allergies  "

## 2024-02-24 NOTE — Patient Instructions (Signed)
 Use warm compresses frequently throughout the day.  If not improving over the next few says you can add erythromycin  ointment.   Eyelid Bump (Stye): What to Know  A stye, also known as a hordeolum, is a bump that forms on an eyelid. It may look like a pimple next to the eyelash. A stye can form inside the eyelid (internal stye) or outside the eyelid (external stye). A stye can cause redness, swelling, and pain on the eyelid. Styes are very common. Anyone can get them at any age. They usually occur in just one eye at a time, but you may have more than one in either eye. What are the causes? A stye is caused by an infection. The infection is almost always caused by bacteria called Staphylococcus aureus. This is a common type of bacteria that lives on the skin. An internal stye may result from an infected oil-producing gland inside the eyelid. An external stye may be caused by an infection at the base of the eyelash (hair follicle). What increases the risk? You are more likely to develop a stye if: You have had a stye before. You have any of these conditions: Red, itchy, inflamed eyelids (blepharitis). A skin condition such as seborrheic dermatitis or rosacea. High fat levels in your blood (lipids). Dry eyes. What are the signs or symptoms? The most common symptom of a stye is eyelid pain. Internal styes are more painful than external styes. Other symptoms may include: Painful swelling of your eyelid. A scratchy feeling in your eye. Tearing and redness of your eye. A pimple-like bump on the edge of the eyelid. Pus draining from the stye. How is this diagnosed? Your health care provider may be able to diagnose a stye just by examining your eye. The health care provider may also check to make sure: You do not have a fever or other signs of a more serious infection. The infection has not spread to other parts of your eye or areas around your eye. How is this treated? Most styes will clear up  in a few days without treatment or with warm compresses applied to the area. You may need to use antibiotic drops or ointment to treat an infection. Sometimes, steroid drops or ointment are used in addition to antibiotics. In some cases, your health care provider may give you a small steroid injection in the eyelid. If your stye does not heal with routine treatment, your health care provider may drain pus from the stye using a thin blade or needle. This may be done if the stye is large, causing a lot of pain, or affecting your vision. Follow these instructions at home: Take over-the-counter and prescription medicines only as told by your health care provider. This includes eye drops or ointments. If you were prescribed an antibiotic medicine, steroid medicine, or both, apply or use them as told by your health care provider. Do not stop using the medicine even if your condition improves. Apply a warm, wet cloth (warm compress) to your eye for 5-10 minutes, 4 to 6 times a day. Clean the affected eyelid as directed by your health care provider. Do not wear contact lenses or eye makeup until your stye has healed and your health care provider says that it is safe. Do not try to pop or drain the stye. Do not rub your eye. Contact a health care provider if: You have chills or a fever. Your stye does not go away after several days. Your stye affects your  vision. Your eyeball becomes swollen, red, or painful. Get help right away if: You have pain when moving your eye around. Summary A stye is a bump that forms on an eyelid. It may look like a pimple next to the eyelash. A stye can form inside the eyelid (internal stye) or outside the eyelid (external stye). A stye can cause redness, swelling, and pain on the eyelid. Your health care provider may be able to diagnose a stye just by examining your eye. Apply a warm, wet cloth (warm compress) to your eye for 5-10 minutes, 4 to 6 times a day. This  information is not intended to replace advice given to you by your health care provider. Make sure you discuss any questions you have with your health care provider. Document Revised: 11/25/2023 Document Reviewed: 03/20/2020 Elsevier Patient Education  2025 [employer].

## 2024-02-24 NOTE — Telephone Encounter (Signed)
 FYI Only or Action Required?: FYI only for provider: appointment scheduled on 02/24/24.  Patient was last seen in primary care on 11/29/2023 by Alvan Dorothyann BIRCH, MD.  Called Nurse Triage reporting Eye Pain.  Symptoms began several days ago.  Interventions attempted: Nothing.  Symptoms are: gradually worsening.  Triage Disposition: See HCP Within 4 Hours (Or PCP Triage)  Patient/caregiver understands and will follow disposition?: Yes  Reason for Disposition  [1] SEVERE eyelid swelling on one side AND [2] sinus pain or pressure  Answer Assessment - Initial Assessment Questions Pt reports left eyelid edema that began 2 days ago, more severe this morning and is painful with palpation. States eye is watery and feels like she may have a stye. Also reports left sided sinus pressure.  1. ONSET: When did the swelling start? (e.g., minutes, hours, days)     2 days 2. LOCATION: What part of the eyelids is swollen?     Left eyelid 3. SEVERITY: How swollen is it?     Able to open the eye 5. PAIN: Is the swelling painful to touch? If Yes, ask: How painful is it?   (Scale 1-10; mild, moderate or severe)     Tender to the touch 6. FEVER: Do you have a fever? If Yes, ask: What is it, how was it measured, and when did it start?      Denies 7. CAUSE: What do you think is causing the swelling?     Unknown, believes she may have a stye 9. OTHER SYMPTOMS: Do you have any other symptoms? (e.g., blurred vision, eye discharge, rash, runny nose)     Sinus pressure  Protocols used: Eye - Swelling-A-AH   Message from [place] D sent at 02/24/2024  9:33 AM EST  Pt says her left eye is really swollen and hurting started yesterday

## 2024-04-25 ENCOUNTER — Ambulatory Visit: Admitting: Family Medicine

## 2024-05-22 ENCOUNTER — Ambulatory Visit: Admitting: Family Medicine
